# Patient Record
Sex: Male | Born: 1950 | Race: White | Hispanic: No | Marital: Married | Smoking: Never smoker
Health system: Southern US, Community
[De-identification: ages and names within clinical notes are randomized; demographics above are authoritative.]

## PROBLEM LIST (undated history)

## (undated) DIAGNOSIS — Q231 Congenital insufficiency of aortic valve: Secondary | ICD-10-CM

## (undated) DIAGNOSIS — I472 Ventricular tachycardia: Secondary | ICD-10-CM

## (undated) DIAGNOSIS — G4700 Insomnia, unspecified: Secondary | ICD-10-CM

## (undated) DIAGNOSIS — I10 Essential (primary) hypertension: Secondary | ICD-10-CM

## (undated) DIAGNOSIS — E782 Mixed hyperlipidemia: Secondary | ICD-10-CM

## (undated) DIAGNOSIS — C801 Malignant (primary) neoplasm, unspecified: Secondary | ICD-10-CM

## (undated) DIAGNOSIS — M6740 Ganglion, unspecified site: Secondary | ICD-10-CM

## (undated) HISTORY — PX: NASAL SINUS SURGERY: SHX719

## (undated) HISTORY — DX: Congenital insufficiency of aortic valve: Q23.1

## (undated) HISTORY — DX: Mixed hyperlipidemia: E78.2

## (undated) HISTORY — DX: Insomnia, unspecified: G47.00

## (undated) HISTORY — PX: APPENDECTOMY: SHX54

## (undated) HISTORY — DX: Essential (primary) hypertension: I10

## (undated) HISTORY — DX: Ventricular tachycardia: I47.2

## (undated) HISTORY — PX: BACK SURGERY: SHX140

## (undated) HISTORY — PX: WISDOM TOOTH EXTRACTION: SHX21

## (undated) HISTORY — PX: LUMBAR DISC SURGERY: SHX700

## (undated) HISTORY — PX: PROSTATE BIOPSY: SHX241

## (undated) HISTORY — PX: MOUTH SURGERY: SHX715

---

## 2015-11-17 DIAGNOSIS — I10 Essential (primary) hypertension: Secondary | ICD-10-CM | POA: Insufficient documentation

## 2015-11-17 DIAGNOSIS — N400 Enlarged prostate without lower urinary tract symptoms: Secondary | ICD-10-CM

## 2015-11-17 DIAGNOSIS — R7303 Prediabetes: Secondary | ICD-10-CM

## 2015-11-17 DIAGNOSIS — R972 Elevated prostate specific antigen [PSA]: Secondary | ICD-10-CM

## 2015-11-17 DIAGNOSIS — G4700 Insomnia, unspecified: Secondary | ICD-10-CM

## 2015-11-17 DIAGNOSIS — D696 Thrombocytopenia, unspecified: Secondary | ICD-10-CM

## 2015-11-17 DIAGNOSIS — E782 Mixed hyperlipidemia: Secondary | ICD-10-CM

## 2015-11-17 DIAGNOSIS — M51369 Other intervertebral disc degeneration, lumbar region without mention of lumbar back pain or lower extremity pain: Secondary | ICD-10-CM

## 2015-11-17 DIAGNOSIS — Q2381 Bicuspid aortic valve: Secondary | ICD-10-CM

## 2015-11-17 DIAGNOSIS — Q231 Congenital insufficiency of aortic valve: Secondary | ICD-10-CM

## 2015-11-17 DIAGNOSIS — I472 Ventricular tachycardia: Secondary | ICD-10-CM

## 2015-11-17 DIAGNOSIS — M5136 Other intervertebral disc degeneration, lumbar region: Secondary | ICD-10-CM | POA: Insufficient documentation

## 2015-11-17 DIAGNOSIS — N529 Male erectile dysfunction, unspecified: Secondary | ICD-10-CM | POA: Insufficient documentation

## 2015-11-17 DIAGNOSIS — I4729 Other ventricular tachycardia: Secondary | ICD-10-CM | POA: Insufficient documentation

## 2015-11-17 HISTORY — DX: Other intervertebral disc degeneration, lumbar region without mention of lumbar back pain or lower extremity pain: M51.369

## 2015-11-17 HISTORY — DX: Benign prostatic hyperplasia without lower urinary tract symptoms: N40.0

## 2015-11-17 HISTORY — DX: Thrombocytopenia, unspecified: D69.6

## 2015-11-17 HISTORY — DX: Bicuspid aortic valve: Q23.81

## 2015-11-17 HISTORY — DX: Male erectile dysfunction, unspecified: N52.9

## 2015-11-17 HISTORY — DX: Other ventricular tachycardia: I47.29

## 2015-11-17 HISTORY — DX: Ventricular tachycardia: I47.2

## 2015-11-17 HISTORY — DX: Congenital insufficiency of aortic valve: Q23.1

## 2015-11-17 HISTORY — DX: Elevated prostate specific antigen (PSA): R97.20

## 2015-11-17 HISTORY — DX: Insomnia, unspecified: G47.00

## 2015-11-17 HISTORY — DX: Essential (primary) hypertension: I10

## 2015-11-17 HISTORY — DX: Prediabetes: R73.03

## 2015-11-17 HISTORY — DX: Mixed hyperlipidemia: E78.2

## 2016-11-23 DIAGNOSIS — M1612 Unilateral primary osteoarthritis, left hip: Secondary | ICD-10-CM | POA: Insufficient documentation

## 2016-11-23 HISTORY — DX: Unilateral primary osteoarthritis, left hip: M16.12

## 2017-05-26 DIAGNOSIS — D1723 Benign lipomatous neoplasm of skin and subcutaneous tissue of right leg: Secondary | ICD-10-CM

## 2017-05-26 HISTORY — DX: Benign lipomatous neoplasm of skin and subcutaneous tissue of right leg: D17.23

## 2017-10-05 ENCOUNTER — Other Ambulatory Visit: Payer: Self-pay | Admitting: Sports Medicine

## 2017-10-05 ENCOUNTER — Ambulatory Visit (INDEPENDENT_AMBULATORY_CARE_PROVIDER_SITE_OTHER): Payer: BLUE CROSS/BLUE SHIELD | Admitting: Sports Medicine

## 2017-10-05 ENCOUNTER — Encounter: Payer: Self-pay | Admitting: Sports Medicine

## 2017-10-05 ENCOUNTER — Ambulatory Visit (INDEPENDENT_AMBULATORY_CARE_PROVIDER_SITE_OTHER): Payer: BLUE CROSS/BLUE SHIELD

## 2017-10-05 DIAGNOSIS — S93402A Sprain of unspecified ligament of left ankle, initial encounter: Secondary | ICD-10-CM | POA: Diagnosis not present

## 2017-10-05 DIAGNOSIS — IMO0001 Reserved for inherently not codable concepts without codable children: Secondary | ICD-10-CM

## 2017-10-05 DIAGNOSIS — M779 Enthesopathy, unspecified: Secondary | ICD-10-CM | POA: Diagnosis not present

## 2017-10-05 DIAGNOSIS — M25572 Pain in left ankle and joints of left foot: Secondary | ICD-10-CM

## 2017-10-05 MED ORDER — MELOXICAM 15 MG PO TABS: 15.0000 mg | ORAL_TABLET | Freq: Every day | ORAL | 0 refills | Status: DC

## 2017-10-05 NOTE — Progress Notes (Signed)
Subjective:  Glen Young is a 67 y.o. male patient who presents to office for evaluation of left ankle pain. Patient complains of continued pain in the ankle over the last 3 months after he turned his ankle and heard a pop states that the pain on average about 5 out of 10 throbbing in nature that can be intermittent depending on what he has been doing. Patient went to urgent care where he had x-rays and was told it was not broken and did not have any other treatment.  Patient denies any other pedal complaints.  Review of Systems  Musculoskeletal: Positive for joint pain.  All other systems reviewed and are negative.    There are no active problems to display for this patient.   Current Outpatient Medications on File Prior to Visit  Medication Sig Dispense Refill  . aspirin EC 81 MG tablet Take 81 mg by mouth daily.    Marland Kitchen lisinopril (PRINIVIL,ZESTRIL) 10 MG tablet Take 10 mg by mouth daily.    . tadalafil (CIALIS) 5 MG tablet Take 5 mg by mouth daily as needed for erectile dysfunction.     No current facility-administered medications on file prior to visit.     Allergies  Allergen Reactions  . Codeine Nausea And Vomiting    Objective:  General: Alert and oriented x3 in no acute distress  Dermatology: No open lesions bilateral lower extremities, no webspace macerations, no ecchymosis bilateral, all nails x 10 are well manicured.  Vascular: Dorsalis Pedis and Posterior Tibial pedal pulses palpable, Capillary Fill Time 3 seconds,(+) pedal hair growth bilateral, no edema bilateral lower extremities, Temperature gradient within normal limits.  Neurology: Michaell Cowing sensation intact via light touch bilateral. (- )Tinels sign bilateral.   Musculoskeletal: Mild tenderness with palpation at lateral ankle ligaments, sinus tarsi, and along peroneal tendon course of the left ankle. Negative talar tilt, Negative tib-fib stress, No frank instability however patient states that when he is walking and  standing sometimes it feels like his ankle is going to move under him. No pain with calf compression bilateral. Range of motion within normal limits with mild guarding on the left ankle. Strength within normal limits in all groups bilateral.   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  Assessment and Plan: Problem List Items Addressed This Visit    None    Visit Diagnoses    Left ankle pain, unspecified chronicity    -  Primary   Relevant Medications   meloxicam (MOBIC) 15 MG tablet   Other Relevant Orders   DG Ankle Complete Left   Grade 2 ankle sprain, left, initial encounter       Relevant Medications   meloxicam (MOBIC) 15 MG tablet   Tendonitis       Relevant Medications   meloxicam (MOBIC) 15 MG tablet      -Complete examination performed -Xrays reviewed -Discussed treatement options for possible sprain with tendinitis versus partial tear -Rx meloxicam to take as instructed -Dispensed ankle gauntlet to offer support and reduce excessive strain to peroneal tendons -Advised patient if pain is not better since his symptoms have been present over 3 months would recommend a MRI for further evaluation of tendons and joint at lateral ankle to rule out partial tear which is possible since patient's symptoms have been ongoing -  Patient to return to office in 3 weeks or sooner if condition worsens.  Asencion Islamitorya Taytem Ghattas, DPM

## 2017-10-26 ENCOUNTER — Ambulatory Visit (INDEPENDENT_AMBULATORY_CARE_PROVIDER_SITE_OTHER): Payer: BLUE CROSS/BLUE SHIELD | Admitting: Sports Medicine

## 2017-10-26 ENCOUNTER — Encounter: Payer: Self-pay | Admitting: Sports Medicine

## 2017-10-26 DIAGNOSIS — M25572 Pain in left ankle and joints of left foot: Secondary | ICD-10-CM | POA: Diagnosis not present

## 2017-10-26 DIAGNOSIS — S93402D Sprain of unspecified ligament of left ankle, subsequent encounter: Secondary | ICD-10-CM

## 2017-10-26 DIAGNOSIS — M779 Enthesopathy, unspecified: Secondary | ICD-10-CM

## 2017-10-26 NOTE — Progress Notes (Signed)
Subjective:  Glen Young is a 67 y.o. male patient who returns to office for follow-up evaluation of left ankle pain. Patient reports that pain in the left ankle seems to be getting better and that he is anti-inflammatory and the ankle support seems like it is helping.  Patient reports that he has also been very cautious when doing form work to be very careful on uneven surfaces or terrains.  Patient denies increase of pain, denies swelling, denies bruising, denies warmth, denies redness, or any other acute problems.  Patient denies any other pedal complaints at this time.  There are no active problems to display for this patient.   Current Outpatient Medications on File Prior to Visit  Medication Sig Dispense Refill  . aspirin EC 81 MG tablet Take 81 mg by mouth daily.    Marland Kitchen. lisinopril (PRINIVIL,ZESTRIL) 10 MG tablet Take 10 mg by mouth daily.    . meloxicam (MOBIC) 15 MG tablet Take 1 tablet (15 mg total) by mouth daily. 30 tablet 0  . tadalafil (CIALIS) 5 MG tablet Take 5 mg by mouth daily as needed for erectile dysfunction.     No current facility-administered medications on file prior to visit.     Allergies  Allergen Reactions  . Codeine Nausea And Vomiting    Objective:  General: Alert and oriented x3 in no acute distress  Dermatology: No open lesions bilateral lower extremities, no webspace macerations, no ecchymosis bilateral, all nails x 10 are well manicured.  Vascular: Dorsalis Pedis and Posterior Tibial pedal pulses palpable, Capillary Fill Time 3 seconds,(+) pedal hair growth bilateral, no edema bilateral lower extremities, Temperature gradient within normal limits.  Neurology: Michaell CowingGross sensation intact via light touch bilateral. (- )Tinels sign bilateral.   Musculoskeletal: Decreased tenderness with palpation at lateral ankle ligaments, sinus tarsi, and along peroneal tendon course of the left ankle. Negative talar tilt, Negative tib-fib stress, No frank instability. No  pain with calf compression bilateral. Range of motion within normal limits with no guarding on the left ankle. Strength within normal limits in all groups bilateral.   Assessment and Plan: Problem List Items Addressed This Visit    None    Visit Diagnoses    Left ankle pain, unspecified chronicity    -  Primary   Sprain of ligament of left ankle, subsequent encounter       Tendonitis          -Complete examination performed -Discussed continued care for sprain with tendinitis versus partial tear -Patient desires to hold off at this time on MRI -Continue with meloxicam until completed -Continue with ankle gauntlet for 1 more month and then slowly wean as instructed -Advised patient to call office if pain worsens after he has finished with his meloxicam so that we can move forward with ordering a MRI for further evaluation pain recurs or worsens once he has completed his medication -Patient to return to office as needed or sooner if condition worsens.  Asencion Islamitorya Cyle Kenyon, DPM

## 2017-11-23 DIAGNOSIS — M19029 Primary osteoarthritis, unspecified elbow: Secondary | ICD-10-CM

## 2017-11-23 HISTORY — DX: Primary osteoarthritis, unspecified elbow: M19.029

## 2017-12-09 DIAGNOSIS — M19042 Primary osteoarthritis, left hand: Secondary | ICD-10-CM

## 2017-12-09 HISTORY — DX: Primary osteoarthritis, left hand: M19.042

## 2018-03-21 ENCOUNTER — Encounter: Payer: Self-pay | Admitting: Cardiology

## 2018-03-21 ENCOUNTER — Ambulatory Visit (INDEPENDENT_AMBULATORY_CARE_PROVIDER_SITE_OTHER): Payer: BLUE CROSS/BLUE SHIELD | Admitting: Cardiology

## 2018-03-21 VITALS — BP 140/74 | HR 92 | Ht 74.0 in | Wt 194.2 lb

## 2018-03-21 DIAGNOSIS — Q231 Congenital insufficiency of aortic valve: Secondary | ICD-10-CM | POA: Diagnosis not present

## 2018-03-21 DIAGNOSIS — E782 Mixed hyperlipidemia: Secondary | ICD-10-CM

## 2018-03-21 DIAGNOSIS — I4729 Other ventricular tachycardia: Secondary | ICD-10-CM

## 2018-03-21 DIAGNOSIS — I472 Ventricular tachycardia: Secondary | ICD-10-CM

## 2018-03-21 DIAGNOSIS — I10 Essential (primary) hypertension: Secondary | ICD-10-CM

## 2018-03-21 NOTE — Patient Instructions (Signed)
Medication Instructions:  Your physician recommends that you continue on your current medications as directed. Please refer to the Current Medication list given to you today.  Labwork: None ordered  Testing/Procedures: Your physician has requested that you have an echocardiogram. Echocardiography is a painless test that uses sound waves to create images of your heart. It provides your doctor with information about the size and shape of your heart and how well your heart's chambers and valves are working. This procedure takes approximately one hour. There are no restrictions for this procedure.  Follow-Up: Your physician recommends that you schedule a follow-up appointment in: 1 year with Dr. Bing Matter. You will receive a letter or phone call to schedule your appointment.   Any Other Special Instructions Will Be Listed Below (If Applicable).     If you need a refill on your cardiac medications before your next appointment, please call your pharmacy.

## 2018-03-21 NOTE — Progress Notes (Signed)
Cardiology Office Note:    Date:  03/21/2018   ID:  Glen Young, DOB Jun 26, 1951, MRN [id number]  PCP:  Gordan Payment., MD  Cardiologist:  Gypsy Balsam, MD    Referring MD: Gordan Payment., MD   Chief Complaint  Patient presents with  . Follow-up    Seen over 3 years ago  . Pre-op Exam  Doing well but want to be checked  History of Present Illness:    Glen Young is a 67 y.o. male with history of bicuspid aortic valve.  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 Bicuspid aortic valve 11/17/2015  . Essential hypertension 11/17/2015  . Insomnia 11/17/2015  . Mixed hyperlipidemia 11/17/2015  . Non-sustained ventricular tachycardia (HCC) 11/17/2015    Past Surgical History:  Procedure Laterality Date  . APPENDECTOMY    . BACK SURGERY    . MOUTH SURGERY    . NASAL SINUS SURGERY    . PROSTATE SURGERY    . WISDOM TOOTH EXTRACTION      Current Medications: Current Meds  Medication Sig  . aspirin EC 81 MG tablet Take 81 mg by mouth daily.  Marland Kitchen lisinopril (PRINIVIL,ZESTRIL) 10 MG tablet Take 10 mg by mouth daily.  . rosuvastatin (CRESTOR) 10 MG tablet Take 1 tablet by mouth daily.  . tadalafil (CIALIS) 5 MG tablet Take 5 mg by mouth daily as needed for erectile dysfunction.     Allergies:   Codeine   Social History   Socioeconomic History  . Marital status: Married    Spouse name: Not on file  . Number of children: Not on file  . Years of education: Not on file  . Highest education level: Not on file  Occupational History  . Not on file  Social Needs  . Financial resource strain: Not on file  . Food insecurity:    Worry: Not on file    Inability: Not on file  .  Transportation needs:    Medical: Not on file    Non-medical: Not on file  Tobacco Use  . Smoking status: Never Smoker  . Smokeless tobacco: Never Used  Substance and Sexual Activity  . Alcohol use: Never    Frequency: Never  . Drug use: Never  . Sexual activity: Not on file  Lifestyle  . Physical activity:    Days per week: Not on file    Minutes per session: Not on file  . Stress: Not on file  Relationships  . Social connections:    Talks on phone: Not on file    Gets together: Not on file    Attends religious service: Not on file    Active member of club or organization: Not on file    Attends meetings of clubs or organizations: Not on file    Relationship status: Not on file  Other Topics Concern  . Not on file  Social History Narrative  . Not on file     Family History: The patient's family history includes Diabetes in his maternal uncle and paternal grandmother; Hypertension in his father. ROS:   Please see the history of present illness.    All 14 point review of systems negative  except as described per history of present illness  EKGs/Labs/Other Studies Reviewed:      Recent Labs: No results found for requested labs within last 8760 hours.  Recent Lipid Panel No results found for: CHOL, TRIG, HDL, CHOLHDL, VLDL, LDLCALC, LDLDIRECT  Physical Exam:    VS:  BP 140/74   Pulse 92   Ht 6\' 2"  (1.88 m)   Wt 194 lb 3.2 oz (88.1 kg)   SpO2 97%   BMI 24.93 kg/m     Wt Readings from Last 3 Encounters:  03/21/18 194 lb 3.2 oz (88.1 kg)     GEN:  Well nourished, well developed in no acute distress HEENT: Normal NECK: No JVD; No carotid bruits LYMPHATICS: No lymphadenopathy CARDIAC: RRR, diastolic murmur grade 1/6 best heard left border of the sternum, no rubs, no gallops RESPIRATORY:  Clear to auscultation without rales, wheezing or rhonchi  ABDOMEN: Soft, non-tender, non-distended MUSCULOSKELETAL:  No edema; No deformity  SKIN: Warm and dry LOWER  EXTREMITIES: no swelling NEUROLOGIC:  Alert and oriented x 3 PSYCHIATRIC:  Normal affect   ASSESSMENT:    1. Bicuspid aortic valve   2. Essential hypertension   3. Non-sustained ventricular tachycardia (HCC)   4. Mixed hyperlipidemia    PLAN:    In order of problems listed above:  Bicuspid aortic valve found to repeat echocardiogram to check on the valve. Essential hypertension blood pressure doing well we will continue present medications. Dyslipidemia he is on Crestor which I will continue.   Medication Adjustments/Labs and Tests Ordered: Current medicines are reviewed at length with the patient today.  Concerns regarding medicines are outlined above.  No orders of the defined types were placed in this encounter.  Medication changes: No orders of the defined types were placed in this encounter.   Signed, Georgeanna Lea, MD, [facility] 03/21/2018 3:25 PM    [facility]

## 2018-05-02 ENCOUNTER — Ambulatory Visit (INDEPENDENT_AMBULATORY_CARE_PROVIDER_SITE_OTHER): Payer: BLUE CROSS/BLUE SHIELD

## 2018-05-02 ENCOUNTER — Other Ambulatory Visit: Payer: Self-pay

## 2018-05-02 DIAGNOSIS — Q231 Congenital insufficiency of aortic valve: Secondary | ICD-10-CM

## 2018-05-02 DIAGNOSIS — I4729 Other ventricular tachycardia: Secondary | ICD-10-CM

## 2018-05-02 DIAGNOSIS — I472 Ventricular tachycardia: Secondary | ICD-10-CM | POA: Diagnosis not present

## 2018-05-02 NOTE — Progress Notes (Signed)
Complete echocardiogram has been performed.  Jimmy Merick Kelleher RDCS, RVT 

## 2018-05-11 DIAGNOSIS — N4340 Spermatocele of epididymis, unspecified: Secondary | ICD-10-CM

## 2018-05-11 HISTORY — DX: Spermatocele of epididymis, unspecified: N43.40

## 2018-05-26 DIAGNOSIS — D638 Anemia in other chronic diseases classified elsewhere: Secondary | ICD-10-CM | POA: Insufficient documentation

## 2018-05-26 DIAGNOSIS — Z862 Personal history of diseases of the blood and blood-forming organs and certain disorders involving the immune mechanism: Secondary | ICD-10-CM

## 2018-05-26 HISTORY — DX: Personal history of diseases of the blood and blood-forming organs and certain disorders involving the immune mechanism: Z86.2

## 2018-05-26 HISTORY — DX: Anemia in other chronic diseases classified elsewhere: D63.8

## 2018-05-29 DIAGNOSIS — J343 Hypertrophy of nasal turbinates: Secondary | ICD-10-CM

## 2018-05-29 HISTORY — DX: Hypertrophy of nasal turbinates: J34.3

## 2018-05-30 ENCOUNTER — Other Ambulatory Visit: Payer: Self-pay | Admitting: Orthopedic Surgery

## 2018-05-31 ENCOUNTER — Other Ambulatory Visit: Payer: Self-pay | Admitting: Orthopedic Surgery

## 2018-06-21 ENCOUNTER — Other Ambulatory Visit: Payer: Self-pay

## 2018-06-21 ENCOUNTER — Encounter (HOSPITAL_BASED_OUTPATIENT_CLINIC_OR_DEPARTMENT_OTHER): Payer: Self-pay | Admitting: *Deleted

## 2018-06-21 NOTE — Progress Notes (Signed)
Pt with EKG (03-21-18) and ECHO (05-02-18) results reviewed with Dr Casilda Carlsob Fitzgerald. [facility]

## 2018-06-27 ENCOUNTER — Encounter (HOSPITAL_BASED_OUTPATIENT_CLINIC_OR_DEPARTMENT_OTHER): Payer: Self-pay | Admitting: *Deleted

## 2018-06-27 ENCOUNTER — Ambulatory Visit (HOSPITAL_BASED_OUTPATIENT_CLINIC_OR_DEPARTMENT_OTHER): Payer: BLUE CROSS/BLUE SHIELD | Admitting: Anesthesiology

## 2018-06-27 ENCOUNTER — Ambulatory Visit (HOSPITAL_BASED_OUTPATIENT_CLINIC_OR_DEPARTMENT_OTHER)
Admission: RE | Admit: 2018-06-27 | Discharge: 2018-06-27 | Disposition: A | Discharge status: Home / Self Care | Payer: BLUE CROSS/BLUE SHIELD | Attending: Orthopedic Surgery | Admitting: Orthopedic Surgery

## 2018-06-27 ENCOUNTER — Encounter (HOSPITAL_BASED_OUTPATIENT_CLINIC_OR_DEPARTMENT_OTHER)
Admission: RE | Disposition: A | Discharge status: Home / Self Care | Payer: Self-pay | Source: Home / Self Care | Attending: Orthopedic Surgery

## 2018-06-27 ENCOUNTER — Other Ambulatory Visit: Payer: Self-pay

## 2018-06-27 DIAGNOSIS — M25842 Other specified joint disorders, left hand: Secondary | ICD-10-CM | POA: Insufficient documentation

## 2018-06-27 DIAGNOSIS — Z79899 Other long term (current) drug therapy: Secondary | ICD-10-CM | POA: Insufficient documentation

## 2018-06-27 DIAGNOSIS — M19042 Primary osteoarthritis, left hand: Secondary | ICD-10-CM | POA: Diagnosis not present

## 2018-06-27 DIAGNOSIS — I10 Essential (primary) hypertension: Secondary | ICD-10-CM | POA: Insufficient documentation

## 2018-06-27 HISTORY — PX: CYST EXCISION: SHX5701

## 2018-06-27 HISTORY — PX: ARTHROTOMY: SHX5728

## 2018-06-27 HISTORY — DX: Ganglion, unspecified site: M67.40

## 2018-06-27 SURGERY — CYST REMOVAL
Anesthesia: Regional | Site: Finger | Laterality: Left

## 2018-06-27 MED ORDER — OXYCODONE HCL 5 MG PO TABS: 5.0000 mg | ORAL_TABLET | Freq: Once | ORAL | Status: DC | PRN

## 2018-06-27 MED ORDER — PROPOFOL 500 MG/50ML IV EMUL
INTRAVENOUS | Status: DC | PRN
  Administered 2018-06-27: 100 ug/kg/min via INTRAVENOUS

## 2018-06-27 MED ORDER — LACTATED RINGERS IV SOLN
INTRAVENOUS | Status: DC
  Administered 2018-06-27: 10:00:00 via INTRAVENOUS

## 2018-06-27 MED ORDER — CEFAZOLIN SODIUM-DEXTROSE 2-4 GM/100ML-% IV SOLN
INTRAVENOUS | Status: AC
  Filled 2018-06-27: qty 100

## 2018-06-27 MED ORDER — OXYCODONE HCL 5 MG/5ML PO SOLN: 5.0000 mg | Freq: Once | ORAL | Status: DC | PRN

## 2018-06-27 MED ORDER — SCOPOLAMINE 1 MG/3DAYS TD PT72: 1.0000 | MEDICATED_PATCH | Freq: Once | TRANSDERMAL | Status: DC | PRN

## 2018-06-27 MED ORDER — BUPIVACAINE HCL (PF) 0.25 % IJ SOLN
INTRAMUSCULAR | Status: DC | PRN
  Administered 2018-06-27: 8 mL

## 2018-06-27 MED ORDER — FENTANYL CITRATE (PF) 100 MCG/2ML IJ SOLN
50.0000 ug | INTRAMUSCULAR | Status: DC | PRN
  Administered 2018-06-27: 50 ug via INTRAVENOUS
  Administered 2018-06-27: 25 ug via INTRAVENOUS

## 2018-06-27 MED ORDER — LABETALOL HCL 5 MG/ML IV SOLN
INTRAVENOUS | Status: DC | PRN
  Administered 2018-06-27 (×2): 5 mg via INTRAVENOUS

## 2018-06-27 MED ORDER — EPHEDRINE 5 MG/ML INJ
INTRAVENOUS | Status: AC
  Filled 2018-06-27: qty 20

## 2018-06-27 MED ORDER — FENTANYL CITRATE (PF) 100 MCG/2ML IJ SOLN
INTRAMUSCULAR | Status: AC
  Filled 2018-06-27: qty 2

## 2018-06-27 MED ORDER — MIDAZOLAM HCL 2 MG/2ML IJ SOLN
1.0000 mg | INTRAMUSCULAR | Status: DC | PRN
  Administered 2018-06-27: 1 mg via INTRAVENOUS

## 2018-06-27 MED ORDER — TRAMADOL HCL 50 MG PO TABS: 50.0000 mg | ORAL_TABLET | Freq: Four times a day (QID) | ORAL | 0 refills | Status: DC | PRN

## 2018-06-27 MED ORDER — FENTANYL CITRATE (PF) 100 MCG/2ML IJ SOLN: 25.0000 ug | INTRAMUSCULAR | Status: DC | PRN

## 2018-06-27 MED ORDER — PROPOFOL 10 MG/ML IV BOLUS
INTRAVENOUS | Status: AC
  Filled 2018-06-27: qty 20

## 2018-06-27 MED ORDER — MEPERIDINE HCL 25 MG/ML IJ SOLN: 6.2500 mg | INTRAMUSCULAR | Status: DC | PRN

## 2018-06-27 MED ORDER — LIDOCAINE HCL (PF) 0.5 % IJ SOLN
INTRAMUSCULAR | Status: DC | PRN
  Administered 2018-06-27: 40 mL via INTRAVENOUS

## 2018-06-27 MED ORDER — CEFAZOLIN SODIUM-DEXTROSE 2-4 GM/100ML-% IV SOLN
2.0000 g | INTRAVENOUS | Status: AC
  Administered 2018-06-27: 2 g via INTRAVENOUS

## 2018-06-27 MED ORDER — MIDAZOLAM HCL 2 MG/2ML IJ SOLN
INTRAMUSCULAR | Status: AC
  Filled 2018-06-27: qty 2

## 2018-06-27 MED ORDER — CHLORHEXIDINE GLUCONATE 4 % EX LIQD: 60.0000 mL | Freq: Once | CUTANEOUS | Status: DC

## 2018-06-27 SURGICAL SUPPLY — 49 items
BANDAGE COBAN STERILE 2 (GAUZE/BANDAGES/DRESSINGS) IMPLANT
BLADE MINI RND TIP GREEN BEAV (BLADE) IMPLANT
BLADE SURG 15 STRL LF DISP TIS (BLADE) ×1 IMPLANT
BLADE SURG 15 STRL SS (BLADE) ×2
BNDG COHESIVE 1X5 TAN STRL LF (GAUZE/BANDAGES/DRESSINGS) ×3 IMPLANT
BNDG COHESIVE 3X5 TAN STRL LF (GAUZE/BANDAGES/DRESSINGS) IMPLANT
BNDG ESMARK 4X9 LF (GAUZE/BANDAGES/DRESSINGS) IMPLANT
BNDG GAUZE ELAST 4 BULKY (GAUZE/BANDAGES/DRESSINGS) IMPLANT
CHLORAPREP W/TINT 26ML (MISCELLANEOUS) ×3 IMPLANT
CORD BIPOLAR FORCEPS 12FT (ELECTRODE) ×3 IMPLANT
COVER BACK TABLE 60X90IN (DRAPES) ×3 IMPLANT
COVER MAYO STAND STRL (DRAPES) ×3 IMPLANT
COVER WAND RF STERILE (DRAPES) IMPLANT
CUFF TOURNIQUET SINGLE 18IN (TOURNIQUET CUFF) ×3 IMPLANT
DECANTER SPIKE VIAL GLASS SM (MISCELLANEOUS) IMPLANT
DRAIN PENROSE 1/2X12 LTX STRL (WOUND CARE) IMPLANT
DRAPE EXTREMITY T 121X128X90 (DRAPE) ×3 IMPLANT
DRAPE SURG 17X23 STRL (DRAPES) ×3 IMPLANT
GAUZE SPONGE 4X4 12PLY STRL (GAUZE/BANDAGES/DRESSINGS) ×3 IMPLANT
GAUZE XEROFORM 1X8 LF (GAUZE/BANDAGES/DRESSINGS) ×3 IMPLANT
GLOVE BIO SURGEON STRL SZ 6.5 (GLOVE) ×2 IMPLANT
GLOVE BIO SURGEONS STRL SZ 6.5 (GLOVE) ×1
GLOVE BIOGEL PI IND STRL 7.0 (GLOVE) ×2 IMPLANT
GLOVE BIOGEL PI IND STRL 8.5 (GLOVE) ×1 IMPLANT
GLOVE BIOGEL PI INDICATOR 7.0 (GLOVE) ×4
GLOVE BIOGEL PI INDICATOR 8.5 (GLOVE) ×2
GLOVE SURG ORTHO 8.0 STRL STRW (GLOVE) ×3 IMPLANT
GOWN STRL REUS W/ TWL LRG LVL3 (GOWN DISPOSABLE) ×2 IMPLANT
GOWN STRL REUS W/TWL LRG LVL3 (GOWN DISPOSABLE) ×4
GOWN STRL REUS W/TWL XL LVL3 (GOWN DISPOSABLE) ×3 IMPLANT
NEEDLE PRECISIONGLIDE 27X1.5 (NEEDLE) ×3 IMPLANT
NS IRRIG 1000ML POUR BTL (IV SOLUTION) ×3 IMPLANT
PACK BASIN DAY SURGERY FS (CUSTOM PROCEDURE TRAY) ×3 IMPLANT
PAD CAST 3X4 CTTN HI CHSV (CAST SUPPLIES) IMPLANT
PADDING CAST ABS 3INX4YD NS (CAST SUPPLIES)
PADDING CAST ABS 4INX4YD NS (CAST SUPPLIES) ×2
PADDING CAST ABS COTTON 3X4 (CAST SUPPLIES) IMPLANT
PADDING CAST ABS COTTON 4X4 ST (CAST SUPPLIES) ×1 IMPLANT
PADDING CAST COTTON 3X4 STRL (CAST SUPPLIES)
SPLINT FINGER 3.25 BULB 911905 (SOFTGOODS) ×3 IMPLANT
SPLINT PLASTER CAST XFAST 3X15 (CAST SUPPLIES) IMPLANT
SPLINT PLASTER XTRA FASTSET 3X (CAST SUPPLIES)
STOCKINETTE 4X48 STRL (DRAPES) ×3 IMPLANT
SUT ETHILON 4 0 PS 2 18 (SUTURE) ×3 IMPLANT
SUT VIC AB 4-0 P2 18 (SUTURE) IMPLANT
SYR BULB 3OZ (MISCELLANEOUS) ×3 IMPLANT
SYR CONTROL 10ML LL (SYRINGE) IMPLANT
TOWEL GREEN STERILE FF (TOWEL DISPOSABLE) ×6 IMPLANT
UNDERPAD 30X30 (UNDERPADS AND DIAPERS) ×3 IMPLANT

## 2018-06-27 NOTE — Op Note (Signed)
NAME: Glen Young MEDICAL RECORD NO: [id number] DATE OF BIRTH: Oct 06, 1950 [facility]  [facility] PHYSICIAN: Nicki ReaperGARY R. 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   ASSISTANT: none   ANESTHESIA:  Bier block with sedation and Local   INTRAVENOUS FLUIDS:  Per anesthesia flow sheet.   ESTIMATED BLOOD LOSS:  Minimal.   COMPLICATIONS:  None.   SPECIMENS:   Cyst and osteophytes   TOURNIQUET TIME:    Total Tourniquet Time Documented: Forearm (Left) - 23 minutes Total: Forearm (Left) - 23 minutes    DISPOSITION:  Stable to PACU.   INDICATIONS: Patient is a 67 year old male with a history of a mass on the distal phalangeal joint of his right middle finger.  This does transilluminate he is desirous having this excised.  Pre-peri-and postoperative course are discussed along with risks and complications.  He is aware that there is no guarantee to the surgery the possibility of infection recurrence ureters to arteries nerves tendons and complete release symptoms dystrophy.  Preoperative area the patient is seen the extremity marked by both patient and surgeon antibiotic given  OPERATIVE COURSE: Patient is brought to the operating room where form based IV regional anesthetic was carried out without difficulty.  Was prepped using ChloraPrep in supine position with left arm free.  A three-minute dry time was allowed timeout taken to confirm patient procedure.  A metacarpal block was given quarter percent bupivacaine without epinephrine.  Approximately 8 cc was used.  After adequate anesthesia was afforded the patient a curvilinear incision was made over the distal phalangeal joint  carried down through subcutaneous tissue.  Bleeders were electrocauterized as necessary with bipolar.  A tunnel was then made under the skin to the cyst distally.  This was removed with a hemostatic rondure and house curette.  The specimen was sent to pathology.  The joint was then opened on its ulnar border.  Just lateral to the extensor tendon.  A synovectomy was performed along with osteophytes from the middle phalanx less than was sent to pathology.  The wound was copiously irrigated with saline.  The skin was closed interrupted 4-0 nylon sutures.  A sterile compressive dressing splint to the finger was applied.  And deflation of the tourniquet all fingers immediately pink.  He was taken to the recovery room for observation in satisfactory condition.  He will be discharged home [facility] in 1 week on Tylenol and ibuprofen for pain with tramadol for breakthrough   Cindee SaltGary Yehia Mcbain, MD Electronically signed, 06/27/18

## 2018-06-27 NOTE — Transfer of Care (Signed)
Immediate Anesthesia Transfer of Care Note  Patient: Glen Young  Procedure(s) Performed: EXCISION MUCOID CYST LEFT MIDDLE FINGER (Left Finger) LEFT MIDDLE FINGER DISTAL INTERPHALANGEAL JOINT ARTHROTOMY (Left Finger)  Patient Location: PACU  Anesthesia Type:MAC and Bier block  Level of Consciousness: drowsy and patient cooperative  Airway & Oxygen Therapy: Patient Spontanous Breathing and Patient connected to face mask oxygen  Post-op Assessment: Report given to RN and Post -op Vital signs reviewed and stable  Post vital signs: Reviewed and stable  Last Vitals:  Vitals Value Taken Time  BP 116/75 06/27/2018 11:40 AM  Temp    Pulse 68 06/27/2018 11:41 AM  Resp 13 06/27/2018 11:41 AM  SpO2 100 % 06/27/2018 11:41 AM  Vitals shown include unvalidated device data.  Last Pain:  Vitals:   06/27/18 1001  TempSrc: Oral  PainSc: 0-No pain      Patients Stated Pain Goal: 1 (37/09/64 3838)  Complications: No apparent anesthesia complications

## 2018-06-27 NOTE — Anesthesia Postprocedure Evaluation (Signed)
Anesthesia Post Note  Patient: Glen Young  Procedure(s) Performed: EXCISION MUCOID CYST LEFT MIDDLE FINGER (Left Finger) LEFT MIDDLE FINGER DISTAL INTERPHALANGEAL JOINT ARTHROTOMY (Left Finger)     Patient location during evaluation: PACU Anesthesia Type: Bier Block Level of consciousness: awake and alert Pain management: pain level controlled Vital Signs Assessment: post-procedure vital signs reviewed and stable Respiratory status: spontaneous breathing Cardiovascular status: stable Anesthetic complications: no    Last Vitals:  Vitals:   06/27/18 1215 06/27/18 1245  BP: 138/77 (!) 151/74  Pulse: 70 68  Resp: 17 20  Temp:  36.7 C  SpO2: 99% 99%    Last Pain:  Vitals:   06/27/18 1245  TempSrc:   PainSc: 0-No pain                 Lewie LoronJohn Devinn Voshell

## 2018-06-27 NOTE — Anesthesia Procedure Notes (Signed)
Anesthesia Regional Block: Bier block (IV Regional)  ° °Pre-Anesthetic Checklist: ,, timeout performed, Correct Patient, Correct Site, Correct Laterality, Correct Procedure, Correct Position, site marked, Risks and benefits discussed,  Surgical consent,  Pre-op evaluation,  At surgeon's request and post-op pain management ° °Laterality: Left ° °Prep: alcohol swabs     °  ° °Procedures:,,,,, intact distal pulses, Esmarch exsanguination, single tourniquet utilized, #20gu IV placed  °Narrative:  °CRNA: Kelly, Michelle M, CRNA ° ° ° ° ° °

## 2018-06-27 NOTE — Discharge Instructions (Addendum)

## 2018-06-27 NOTE — Brief Op Note (Signed)
06/27/2018  11:37 AM  PATIENT:  Glen Young  67 y.o. male  PRE-OPERATIVE DIAGNOSIS:  MUCOID CYST LEFT MIDDLE FINGER DISTAL INTERPHALANGEAL JOINT M67.4  POST-OPERATIVE DIAGNOSIS:  MUCOID CYST LEFT MIDDLE FINGER DISTAL   PROCEDURE:  Procedure(s): EXCISION MUCOID CYST LEFT MIDDLE FINGER (Left) LEFT MIDDLE FINGER DISTAL INTERPHALANGEAL JOINT ARTHROTOMY (Left)  SURGEON:  Surgeon(s) and Role:    Cindee Salt* Rumi Kolodziej, MD - Primary  PHYSICIAN ASSISTANT:   ASSISTANTS: none   ANESTHESIA:   local, regional and IV sedation  EBL: 1ml  BLOOD ADMINISTERED:none  DRAINS: none   LOCAL MEDICATIONS USED:  BUPIVICAINE   SPECIMEN:  Excision  DISPOSITION OF SPECIMEN:  PATHOLOGY  COUNTS:  YES  TOURNIQUET:   Total Tourniquet Time Documented: Forearm (Left) - 23 minutes Total: Forearm (Left) - 23 minutes   DICTATION: .Reubin Milanragon Dictation  PLAN OF CARE: Discharge to home after PACU  PATIENT DISPOSITION:  PACU - hemodynamically stable.

## 2018-06-27 NOTE — Anesthesia Preprocedure Evaluation (Addendum)
Anesthesia Evaluation  Patient identified by MRN, date of birth, ID band Patient awake    Reviewed: Allergy & Precautions, NPO status , Patient's Chart, lab work & pertinent test results  Airway Mallampati: II  TM Distance: >3 FB Neck ROM: Full    Dental  (+) Dental Advisory Given   Pulmonary neg pulmonary ROS,    Pulmonary exam normal breath sounds clear to auscultation       Cardiovascular hypertension, Pt. on medications + Valvular Problems/Murmurs AI  Rhythm:Regular Rate:Normal + Diastolic murmurs Echo 04/2018 - Left ventricle: The cavity size was normal. Wall thickness was normal. Systolic function was normal. The estimated ejection fraction was in the range of 55% to 60%. Wall motion was normal; there were no regional wall motion abnormalities. Doppler parameters are consistent with abnormal left ventricular relaxation (grade 1 diastolic dysfunction). - Aortic valve: Bicuspid; normal thickness leaflets. There was moderate regurgitation. - Aortic root: The aortic root was moderately dilated. - Ascending aorta: The ascending aorta was moderately dilated. - Mitral valve: There was mild regurgitation.   Neuro/Psych negative neurological ROS  negative psych ROS   GI/Hepatic negative GI ROS, Neg liver ROS,   Endo/Other  negative endocrine ROS  Renal/GU negative Renal ROS     Musculoskeletal negative musculoskeletal ROS (+)   Abdominal   Peds  Hematology negative hematology ROS (+)   Anesthesia Other Findings   Reproductive/Obstetrics                             Anesthesia Physical Anesthesia Plan  ASA: III  Anesthesia Plan: Bier Block and Bier Block-LIDOCAINE ONLY   Post-op Pain Management:    Induction: Intravenous  PONV Risk Score and Plan: 1 and Propofol infusion, Ondansetron and Treatment may vary due to age or medical condition  Airway Management Planned: Simple Face  Mask  Additional Equipment: None  Intra-op Plan:   Post-operative Plan:   Informed Consent: I have reviewed the patients History and Physical, chart, labs and discussed the procedure including the risks, benefits and alternatives for the proposed anesthesia with the patient or authorized representative who has indicated his/her understanding and acceptance.   Dental advisory given  Plan Discussed with: CRNA  Anesthesia Plan Comments:        Anesthesia Quick Evaluation

## 2018-06-27 NOTE — H&P (Signed)
Glen Young is an 67 y.o. male.   Chief Complaint: mass left middle fingerHPI: Glen BachCecil is a 67 year old right-hand-dominant male referred by Dr. Fatima BlankKimmel for consultation regarding a mass on his left middle finger distal interphalangeal joint while nailbed area. This has increased in size over the past several months. He recalls no history of injury. He states it is been present approximately 2 to 3 months. He is not having any pain. There is no history of injury. He has no history of diabetes thyroid problems or gout. Does have a history of arthritis. Family history is positive diabetes negative for thyroid problems arthritis and gout. He has been tested for diabetes. He is a Visual merchandiserfarmer.   Past Medical History:  Diagnosis Date  . Bicuspid aortic valve 11/17/2015  . Essential hypertension 11/17/2015  . Insomnia 11/17/2015  . Mixed hyperlipidemia 11/17/2015  . 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 Non-sustained ventricular tachycardia (HCC) 11/17/2015    Past Surgical History:  Procedure Laterality Date  . APPENDECTOMY    . BACK SURGERY    . LUMBAR DISC SURGERY    . MOUTH SURGERY    . NASAL SINUS SURGERY    . WISDOM TOOTH EXTRACTION      Family History  Problem Relation Age of Onset  . Diabetes Maternal Uncle   . Diabetes Paternal Grandmother   . Hypertension Father    Social History:  reports that he has never smoked. He has never used smokeless tobacco. He reports that he does not drink alcohol or use drugs.  Allergies:  Allergies  Allergen Reactions  . Codeine Nausea And Vomiting    No medications prior to admission.    No results found for this or any previous visit (from the past 48 hour(s)).  No results found.   Pertinent items are noted in HPI.  Height 6\' 2"  (1.88 m), weight 86.2 kg.  General appearance: alert, cooperative and appears stated age Head: Normocephalic, without obvious abnormality Neck: no JVD Resp: clear to auscultation bilaterally Cardio: regular  rate and rhythm, S1, S2 normal, no murmur, click, rub or gallop GI: soft, non-tender; bowel sounds normal; no masses,  no organomegaly Extremities: mass left middle finger Pulses: 2+ and symmetric Skin: Skin color, texture, turgor normal. No rashes or lesions Neurologic: Grossly normal Incision/Wound: Na   Assessment/Plan Assessment:  1. Mucoid cyst of joint Left middle 2. Osteoarthritis of finger of left hand    Plan: We have discussed the etiology of the problem with him. We have discussed possibility of surgical excision with debridement of the distal and phalangeal joint. Pre-peri-and postoperative course are discussed along with risk applications. He is aware that there is no guarantee to the surgery the possibility of infection recurrence injury to arteries nerves tendons complete relief symptoms and dystrophy. Like to proceed and isscheduled for excision.t   [name] 06/27/2018, 5:32 AM

## 2018-06-28 ENCOUNTER — Encounter (HOSPITAL_BASED_OUTPATIENT_CLINIC_OR_DEPARTMENT_OTHER): Payer: Self-pay | Admitting: Orthopedic Surgery

## 2018-12-19 DIAGNOSIS — J339 Nasal polyp, unspecified: Secondary | ICD-10-CM

## 2018-12-19 HISTORY — DX: Nasal polyp, unspecified: J33.9

## 2018-12-25 DIAGNOSIS — M8949 Other hypertrophic osteoarthropathy, multiple sites: Secondary | ICD-10-CM

## 2018-12-25 DIAGNOSIS — M159 Polyosteoarthritis, unspecified: Secondary | ICD-10-CM

## 2018-12-25 DIAGNOSIS — M150 Primary generalized (osteo)arthritis: Secondary | ICD-10-CM

## 2018-12-25 HISTORY — DX: Other hypertrophic osteoarthropathy, multiple sites: M89.49

## 2018-12-25 HISTORY — DX: Polyosteoarthritis, unspecified: M15.9

## 2018-12-25 HISTORY — DX: Primary generalized (osteo)arthritis: M15.0

## 2019-06-19 ENCOUNTER — Other Ambulatory Visit: Payer: Self-pay

## 2019-06-19 ENCOUNTER — Encounter: Payer: Self-pay | Admitting: Cardiology

## 2019-06-19 ENCOUNTER — Ambulatory Visit (INDEPENDENT_AMBULATORY_CARE_PROVIDER_SITE_OTHER): Payer: Medicare Other | Admitting: Cardiology

## 2019-06-19 VITALS — BP 142/76 | HR 76 | Ht 74.0 in | Wt 200.0 lb

## 2019-06-19 DIAGNOSIS — E782 Mixed hyperlipidemia: Secondary | ICD-10-CM

## 2019-06-19 DIAGNOSIS — Q231 Congenital insufficiency of aortic valve: Secondary | ICD-10-CM

## 2019-06-19 DIAGNOSIS — I10 Essential (primary) hypertension: Secondary | ICD-10-CM | POA: Diagnosis not present

## 2019-06-19 NOTE — Progress Notes (Signed)
Cardiology Office Note:    Date:  06/19/2019   ID:  [name], DOB 1951/01/18, MRN [id number]  PCP:  Gordan Payment., MD  Cardiologist:  Gypsy Balsam, MD    Referring MD: Gordan Payment., MD   Chief Complaint  Patient presents with  . Follow-up  Doing great  History of Present Illness:    Glen Young is a 68 y.o. male with bicuspid arctic valve likely no stenosis only moderate aortic insufficiency.  He is aortic root size last time was 41 mm.  He comes today to my office for follow-up.  Overall doing great.  He is asymptomatic.  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  He has he tells me he cannot do as much as he did when he was 20 but overall seems to be doing very well.  Recently he ordered elliptical and waiting for delivery of that he is planning to exercise on the regular basis which I encouraged.  However I warned him against using too much of isovolumetric exercises.  Past Medical History:  Diagnosis Date  . Bicuspid aortic valve 11/17/2015  . Essential hypertension 11/17/2015  . Insomnia 11/17/2015  . Mixed hyperlipidemia 11/17/2015  . 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 Non-sustained ventricular tachycardia (HCC) 11/17/2015    Past Surgical History:  Procedure Laterality Date  . APPENDECTOMY    . ARTHROTOMY Left 06/27/2018   Procedure: LEFT MIDDLE FINGER DISTAL INTERPHALANGEAL JOINT ARTHROTOMY;  Surgeon: Cindee Salt, MD;  Location: [facility];  Service: Orthopedics;  Laterality: Left;  . BACK SURGERY    . CYST EXCISION Left 06/27/2018   Procedure: EXCISION MUCOID CYST LEFT MIDDLE FINGER;  Surgeon: Cindee Salt, MD;  Location: [facility];  Service: Orthopedics;  Laterality: Left;  . LUMBAR DISC SURGERY    . MOUTH SURGERY    . NASAL SINUS SURGERY    . WISDOM TOOTH EXTRACTION      Current Medications: Current Meds   Medication Sig  . aspirin EC 81 MG tablet Take 81 mg by mouth daily.  Marland Kitchen ibuprofen (ADVIL,MOTRIN) 800 MG tablet Take 800 mg by mouth every 8 (eight) hours as needed.  Marland Kitchen lisinopril (PRINIVIL,ZESTRIL) 10 MG tablet Take 10 mg by mouth daily.  . Multiple Vitamins-Minerals (PRESERVISION AREDS 2) CAPS Take by mouth.  . rosuvastatin (CRESTOR) 10 MG tablet Take 1 tablet by mouth daily.  . tadalafil (CIALIS) 5 MG tablet Take 5 mg by mouth daily as needed for erectile dysfunction.  . traMADol (ULTRAM) 50 MG tablet Take 1 tablet (50 mg total) by mouth every 6 (six) hours as needed.     Allergies:   Codeine   Social History   Socioeconomic History  . Marital status: Married    Spouse name: Not on file  . Number of children: Not on file  . Years of education: Not on file  . Highest education level: Not on file  Occupational History  . Not on file  Social Needs  . Financial resource strain: Not on file  . Food insecurity    Worry: Not on file    Inability: Not on file  . Transportation needs    Medical: Not on file    Non-medical: Not on file  Tobacco Use  . Smoking status: Never Smoker  . Smokeless tobacco: Never Used  Substance and Sexual Activity  . Alcohol use: Never    Frequency: Never  . Drug use: Never  . Sexual activity: Not on file  Lifestyle  . Physical activity    Days per week: Not on file    Minutes per session: Not on file  . Stress: Not on file  Relationships  . Social Herbalist on phone: Not on file    Gets together: Not on file    Attends religious service: Not on file    Active member of club or organization: Not on file    Attends meetings of clubs or organizations: Not on file    Relationship status: Not on file  Other Topics Concern  . Not on file  Social History Narrative  . Not on file     Family History: The patient's family history includes Diabetes in his maternal uncle and paternal grandmother; Hypertension in his father. ROS:    Please see the history of present illness.    All 14 point review of systems negative except as described per history of present illness  EKGs/Labs/Other Studies Reviewed:      Recent Labs: No results found for requested labs within last 8760 hours.  Recent Lipid Panel No results found for: CHOL, TRIG, HDL, CHOLHDL, VLDL, LDLCALC, LDLDIRECT  Physical Exam:    VS:  BP (!) 142/76   Pulse 76   Ht 6\' 2"  (1.88 m)   Wt 200 lb (90.7 kg)   SpO2 98%   BMI 25.68 kg/m     Wt Readings from Last 3 Encounters:  06/19/19 200 lb (90.7 kg)  06/27/18 193 lb 9 oz (87.8 kg)  03/21/18 194 lb 3.2 oz (88.1 kg)     GEN:  Well nourished, well developed in no acute distress HEENT: Normal NECK: No JVD; No carotid bruits LYMPHATICS: No lymphadenopathy CARDIAC: RRR, blowing diastolic murmur best heard at the left border of the sternum, no rubs, no gallops RESPIRATORY:  Clear to auscultation without rales, wheezing or rhonchi  ABDOMEN: Soft, non-tender, non-distended MUSCULOSKELETAL:  No edema; No deformity  SKIN: Warm and dry LOWER EXTREMITIES: no swelling NEUROLOGIC:  Alert and oriented x 3 PSYCHIATRIC:  Normal affect   ASSESSMENT:    1. Bicuspid aortic valve   2. Mixed hyperlipidemia   3. Essential hypertension    PLAN:    In order of problems listed above:  1. Bicuspid aortic valve.  His problem is aortic insufficiency.  Echocardiogram will be done.  Will look also the aortic root. 2. Mild dyslipidemia followed by primary care physician she is scheduled to see him in December 23.  Will wait for fasting blood profile. 3. Essential hypertension seems to be well controlled.  Overall he is doing fine.  We will follow him for his bicuspid arctic valve.  The problem is aortic insufficiency.  He also got a large aortic root measuring 41 mm.  We will recheck with echocardiogram.  Anticipate echocardiogram to be done in January   Medication Adjustments/Labs and Tests Ordered: Current  medicines are reviewed at length with the patient today.  Concerns regarding medicines are outlined above.  No orders of the defined types were placed in this encounter.  Medication changes: No orders of the defined types were placed in this encounter.   Signed, Park Liter, MD, [facility] 06/19/2019 9:04 AM    [place]

## 2019-06-19 NOTE — Patient Instructions (Signed)
Medication Instructions:  Your physician recommends that you continue on your current medications as directed. Please refer to the Current Medication list given to you today.  *If you need a refill on your cardiac medications before your next appointment, please call your pharmacy*  Lab Work: None.  If you have labs (blood work) drawn today and your tests are completely normal, you will receive your results only by: . MyChart Message (if you have MyChart) OR . A paper copy in the mail If you have any lab test that is abnormal or we need to change your treatment, we will call you to review the results.  Testing/Procedures: Your physician has requested that you have an echocardiogram. Echocardiography is a painless test that uses sound waves to create images of your heart. It provides your doctor with information about the size and shape of your heart and how well your heart's chambers and valves are working. This procedure takes approximately one hour. There are no restrictions for this procedure.    Follow-Up: At [facility], you and your health needs are our priority.  As part of our continuing mission to provide you with exceptional heart care, we have created designated Provider Care Teams.  These Care Teams include your primary Cardiologist (physician) and Advanced Practice Providers (APPs -  Physician Assistants and Nurse Practitioners) who all work together to provide you with the care you need, when you need it.  Your next appointment:   1 year(s)  The format for your next appointment:   In Person  Provider:   Robert Krasowski, MD  Other Instructions   Echocardiogram An echocardiogram is a procedure that uses painless sound waves (ultrasound) to produce an image of the heart. Images from an echocardiogram can provide important information about:  Signs of coronary artery disease (CAD).  Aneurysm detection. An aneurysm is a weak or damaged part of an artery wall that  bulges out from the normal force of blood pumping through the body.  Heart size and shape. Changes in the size or shape of the heart can be associated with certain conditions, including heart failure, aneurysm, and CAD.  Heart muscle function.  Heart valve function.  Signs of a past heart attack.  Fluid buildup around the heart.  Thickening of the heart muscle.  A tumor or infectious growth around the heart valves. Tell a health care provider about:  Any allergies you have.  All medicines you are taking, including vitamins, herbs, eye drops, creams, and over-the-counter medicines.  Any blood disorders you have.  Any surgeries you have had.  Any medical conditions you have.  Whether you are pregnant or may be pregnant. What are the risks? Generally, this is a safe procedure. However, problems may occur, including:  Allergic reaction to dye (contrast) that may be used during the procedure. What happens before the procedure? No specific preparation is needed. You may eat and drink normally. What happens during the procedure?   An IV tube may be inserted into one of your veins.  You may receive contrast through this tube. A contrast is an injection that improves the quality of the pictures from your heart.  A gel will be applied to your chest.  A wand-like tool (transducer) will be moved over your chest. The gel will help to transmit the sound waves from the transducer.  The sound waves will harmlessly bounce off of your heart to allow the heart images to be captured in real-time motion. The images will be recorded   on a computer. The procedure may vary among health care providers and hospitals. What happens after the procedure?  You may return to your normal, everyday life, including diet, activities, and medicines, unless your health care provider tells you not to do that. Summary  An echocardiogram is a procedure that uses painless sound waves (ultrasound) to produce  an image of the heart.  Images from an echocardiogram can provide important information about the size and shape of your heart, heart muscle function, heart valve function, and fluid buildup around your heart.  You do not need to do anything to prepare before this procedure. You may eat and drink normally.  After the echocardiogram is completed, you may return to your normal, everyday life, unless your health care provider tells you not to do that. This information is not intended to replace advice given to you by your health care provider. Make sure you discuss any questions you have with your health care provider. Document Released: 06/25/2000 Document Revised: 10/19/2018 Document Reviewed: 07/31/2016 Elsevier Patient Education  2020 Elsevier Inc[employer]   

## 2019-07-08 DIAGNOSIS — R3129 Other microscopic hematuria: Secondary | ICD-10-CM | POA: Insufficient documentation

## 2019-07-08 DIAGNOSIS — E611 Iron deficiency: Secondary | ICD-10-CM

## 2019-07-08 DIAGNOSIS — Z8639 Personal history of other endocrine, nutritional and metabolic disease: Secondary | ICD-10-CM

## 2019-07-08 HISTORY — DX: Iron deficiency: E61.1

## 2019-07-08 HISTORY — DX: Personal history of other endocrine, nutritional and metabolic disease: Z86.39

## 2019-07-08 HISTORY — DX: Other microscopic hematuria: R31.29

## 2019-07-20 ENCOUNTER — Ambulatory Visit (INDEPENDENT_AMBULATORY_CARE_PROVIDER_SITE_OTHER): Payer: Medicare Other

## 2019-07-20 ENCOUNTER — Other Ambulatory Visit: Payer: Self-pay

## 2019-07-20 DIAGNOSIS — Q231 Congenital insufficiency of aortic valve: Secondary | ICD-10-CM | POA: Diagnosis not present

## 2019-07-20 NOTE — Progress Notes (Unsigned)
Complete echocardiogram has been performed.  Jimmy Kristianne Albin RDCS, RVT 

## 2020-08-29 DIAGNOSIS — R6889 Other general symptoms and signs: Secondary | ICD-10-CM

## 2020-08-29 HISTORY — DX: Other general symptoms and signs: R68.89

## 2020-09-03 ENCOUNTER — Other Ambulatory Visit: Payer: Self-pay | Admitting: Radiology

## 2020-09-03 DIAGNOSIS — R6889 Other general symptoms and signs: Secondary | ICD-10-CM

## 2020-09-22 ENCOUNTER — Ambulatory Visit
Admission: RE | Admit: 2020-09-22 | Discharge: 2020-09-22 | Disposition: A | Discharge status: Home / Self Care | Payer: Medicare Other | Source: Ambulatory Visit | Attending: Radiology | Admitting: Radiology

## 2020-09-22 DIAGNOSIS — R6889 Other general symptoms and signs: Secondary | ICD-10-CM

## 2020-09-22 MED ORDER — GADOBENATE DIMEGLUMINE 529 MG/ML IV SOLN
18.0000 mL | Freq: Once | INTRAVENOUS | Status: AC | PRN
  Administered 2020-09-22: 18 mL via INTRAVENOUS

## 2020-09-23 DIAGNOSIS — M6740 Ganglion, unspecified site: Secondary | ICD-10-CM | POA: Insufficient documentation

## 2020-09-25 ENCOUNTER — Other Ambulatory Visit: Payer: Self-pay

## 2020-09-25 ENCOUNTER — Ambulatory Visit (INDEPENDENT_AMBULATORY_CARE_PROVIDER_SITE_OTHER): Payer: Medicare Other | Admitting: Cardiology

## 2020-09-25 ENCOUNTER — Encounter: Payer: Self-pay | Admitting: Cardiology

## 2020-09-25 VITALS — BP 160/82 | HR 86 | Ht 72.0 in | Wt 202.0 lb

## 2020-09-25 DIAGNOSIS — Q231 Congenital insufficiency of aortic valve: Secondary | ICD-10-CM

## 2020-09-25 DIAGNOSIS — I10 Essential (primary) hypertension: Secondary | ICD-10-CM | POA: Diagnosis not present

## 2020-09-25 DIAGNOSIS — I7121 Aneurysm of the ascending aorta, without rupture: Secondary | ICD-10-CM

## 2020-09-25 DIAGNOSIS — I712 Thoracic aortic aneurysm, without rupture: Secondary | ICD-10-CM | POA: Diagnosis not present

## 2020-09-25 HISTORY — DX: Aneurysm of the ascending aorta, without rupture: I71.21

## 2020-09-25 NOTE — Patient Instructions (Signed)
Medication Instructions:  Your physician recommends that you continue on your current medications as directed. Please refer to the Current Medication list given to you today.  *If you need a refill on your cardiac medications before your next appointment, please call your pharmacy*   Lab Work: None If you have labs (blood work) drawn today and your tests are completely normal, you will receive your results only by: Marland Kitchen MyChart Message (if you have MyChart) OR . A paper copy in the mail If you have any lab test that is abnormal or we need to change your treatment, we will call you to review the results.   Testing/Procedures: Your physician has requested that you have an echocardiogram. Echocardiography is a painless test that uses sound waves to create images of your heart. It provides your doctor with information about the size and shape of your heart and how well your heart's chambers and valves are working. This procedure takes approximately one hour. There are no restrictions for this procedure.  Non-Cardiac CT scanning, (CAT scanning), is a noninvasive, special x-ray that produces cross-sectional images of the body using x-rays and a computer. CT scans help physicians diagnose and treat medical conditions. For some CT exams, a contrast material is used to enhance visibility in the area of the body being studied. CT scans provide greater clarity and reveal more details than regular x-ray exams.     Follow-Up: At [facility], you and your health needs are our priority.  As part of our continuing mission to provide you with exceptional heart care, we have created designated Provider Care Teams.  These Care Teams include your primary Cardiologist (physician) and Advanced Practice Providers (APPs -  Physician Assistants and Nurse Practitioners) who all work together to provide you with the care you need, when you need it.  We recommend signing up for the patient portal called "MyChart".   Sign up information is provided on this After Visit Summary.  MyChart is used to connect with patients for Virtual Visits (Telemedicine).  Patients are able to view lab/test results, encounter notes, upcoming appointments, etc.  Non-urgent messages can be sent to your provider as well.   To learn more about what you can do with MyChart, go to [email or web address].    Your next appointment:   1 year(s)  The format for your next appointment:   In Person  Provider:   Gypsy Balsam, MD   Other Instructions   Echocardiogram An echocardiogram is a test that uses sound waves (ultrasound) to produce images of the heart. Images from an echocardiogram can provide important information about:  Heart size and shape.  The size and thickness and movement of your heart's walls.  Heart muscle function and strength.  Heart valve function or if you have stenosis. Stenosis is when the heart valves are too narrow.  If blood is flowing backward through the heart valves (regurgitation).  A tumor or infectious growth around the heart valves.  Areas of heart muscle that are not working well because of poor blood flow or injury from a heart attack.  Aneurysm detection. An aneurysm is a weak or damaged part of an artery wall. The wall bulges out from the normal force of blood pumping through the body. Tell a health care provider about:  Any allergies you have.  All medicines you are taking, including vitamins, herbs, eye drops, creams, and over-the-counter medicines.  Any blood disorders you have.  Any surgeries you have had.  Any medical conditions you  have.  Whether you are pregnant or may be pregnant. What are the risks? Generally, this is a safe test. However, problems may occur, including an allergic reaction to dye (contrast) that may be used during the test. What happens before the test? No specific preparation is needed. You may eat and drink normally. What happens during the  test?  You will take off your clothes from the waist up and put on a hospital gown.  Electrodes or electrocardiogram (ECG)patches may be placed on your chest. The electrodes or patches are then connected to a device that monitors your heart rate and rhythm.  You will lie down on a table for an ultrasound exam. A gel will be applied to your chest to help sound waves pass through your skin.  A handheld device, called a transducer, will be pressed against your chest and moved over your heart. The transducer produces sound waves that travel to your heart and bounce back (or "echo" back) to the transducer. These sound waves will be captured in real-time and changed into images of your heart that can be viewed on a video monitor. The images will be recorded on a computer and reviewed by your health care provider.  You may be asked to change positions or hold your breath for a short time. This makes it easier to get different views or better views of your heart.  In some cases, you may receive contrast through an IV in one of your veins. This can improve the quality of the pictures from your heart. The procedure may vary among health care providers and hospitals.   What can I expect after the test? You may return to your normal, everyday life, including diet, activities, and medicines, unless your health care provider tells you not to do that. Follow these instructions at home:  It is up to you to get the results of your test. Ask your health care provider, or the department that is doing the test, when your results will be ready.  Keep all follow-up visits. This is important. Summary  An echocardiogram is a test that uses sound waves (ultrasound) to produce images of the heart.  Images from an echocardiogram can provide important information about the size and shape of your heart, heart muscle function, heart valve function, and other possible heart problems.  You do not need to do anything to  prepare before this test. You may eat and drink normally.  After the echocardiogram is completed, you may return to your normal, everyday life, unless your health care provider tells you not to do that. This information is not intended to replace advice given to you by your health care provider. Make sure you discuss any questions you have with your health care provider. Document Revised: 02/19/2020 Document Reviewed: 02/19/2020 Elsevier Patient Education  2021 [employer].

## 2020-09-25 NOTE — Progress Notes (Signed)
Cardiology Office Note:    Date:  09/25/2020   ID:  Glen Young, DOB 05/07/1951, MRN [id number]  PCP:  Gordan Payment., MD  Cardiologist:  Gypsy Balsam, MD    Referring MD: Gordan Payment., MD   Chief Complaint  Patient presents with  . Follow-up  I am doing great  History of Present Illness:    Glen Young is a 70 y.o. 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  Comes today 2 months of follow-up.  Overall he is doing great he is asymptomatic, denies have any chest pain tightness squeezing pressure burning chest.  He does what he wants.  He admits that he does not exercise as much as he should but overall seems to be doing well.  Past Medical History:  Diagnosis Date  . Abnormal digital rectal exam 08/29/2020  . Anemia, chronic disease 05/26/2018   Formatting of this note might be different from the original. Mild/minimal. Fe def as well. GI negative workup in 2021.  Marland Kitchen Benign prostatic hyperplasia 11/17/2015   Formatting of this note might be different from the original. Seeing Dr. Logan Bores.  . Bicuspid aortic valve 11/17/2015  . Elevated PSA 11/17/2015   Formatting of this note might be different from the original. Evans  . Essential hypertension 11/17/2015  . Hypertrophy of inferior nasal turbinate 05/29/2018   Last Assessment & Plan:  Formatting of this note might be different from the original. Concern over nasal obstruction. Several month history of nasal congestive symptoms worse on the right side.  At times requiring Afrin therapy.  Not daily though.  No infectious sounding symptoms.  History of sinus surgery x2 in the past. EXAM shows tremendous inferior turbinates bilaterally.  After decongesting,  . Insomnia 11/17/2015  . Iron deficiency 07/08/2019   Formatting of this note might be different from the original. Negative workup in 2021.  . Microscopic  hematuria 07/08/2019   Formatting of this note might be different from the original. Evans.  . Mixed hyperlipidemia 11/17/2015  . 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 Nasal polyp 12/19/2018  . Non-sustained ventricular tachycardia (HCC) 11/17/2015  . Primary osteoarthritis involving multiple joints 12/25/2018  . Spermatocele of epididymis 05/11/2018    Past Surgical History:  Procedure Laterality Date  . APPENDECTOMY    . ARTHROTOMY Left 06/27/2018   Procedure: LEFT MIDDLE FINGER DISTAL INTERPHALANGEAL JOINT ARTHROTOMY;  Surgeon: Cindee Salt, MD;  Location: [facility];  Service: Orthopedics;  Laterality: Left;  . BACK SURGERY    . CYST EXCISION Left 06/27/2018   Procedure: EXCISION MUCOID CYST LEFT MIDDLE FINGER;  Surgeon: Cindee Salt, MD;  Location: [facility];  Service: Orthopedics;  Laterality: Left;  . LUMBAR DISC SURGERY    . MOUTH SURGERY    . NASAL SINUS SURGERY    . WISDOM TOOTH EXTRACTION      Current Medications: Current Meds  Medication Sig  . aspirin EC 81 MG tablet Take 81 mg by mouth daily.  Marland Kitchen ibuprofen (ADVIL,MOTRIN) 800 MG tablet Take 800 mg by mouth every 8 (eight) hours as needed for mild pain.  Marland Kitchen lisinopril (PRINIVIL,ZESTRIL) 10 MG tablet Take 10 mg by mouth daily.  . Multiple Vitamins-Minerals (PRESERVISION AREDS 2) CAPS Take by mouth.  . rosuvastatin (CRESTOR) 10 MG tablet Take 1 tablet by mouth daily.  . tadalafil (CIALIS) 5 MG tablet Take 5 mg  by mouth daily as needed for erectile dysfunction.  . traMADol (ULTRAM) 50 MG tablet Take 1 tablet (50 mg total) by mouth every 6 (six) hours as needed. (Patient taking differently: Take 50 mg by mouth every 6 (six) hours as needed for severe pain or moderate pain.)     Allergies:   Codeine   Social History   Socioeconomic History  . Marital status: Married    Spouse name: Not on file  . Number of children: Not on file  . Years of education: Not on file  . Highest education  level: Not on file  Occupational History  . Not on file  Tobacco Use  . Smoking status: Never Smoker  . Smokeless tobacco: Never Used  Vaping Use  . Vaping Use: Never used  Substance and Sexual Activity  . Alcohol use: Never  . Drug use: Never  . Sexual activity: Not on file  Other Topics Concern  . Not on file  Social History Narrative  . Not on file   Social Determinants of Health   Financial Resource Strain: Not on file  Food Insecurity: Not on file  Transportation Needs: Not on file  Physical Activity: Not on file  Stress: Not on file  Social Connections: Not on file     Family History: The patient's family history includes Diabetes in his maternal uncle and paternal grandmother; Hypertension in his father. ROS:   Please see the history of present illness.    All 14 point review of systems negative except as described per history of present illness  EKGs/Labs/Other Studies Reviewed:      Recent Labs: No results found for requested labs within last 8760 hours.  Recent Lipid Panel No results found for: CHOL, TRIG, HDL, CHOLHDL, VLDL, LDLCALC, LDLDIRECT  Physical Exam:    VS:  BP (!) 160/82 (BP Location: Right Arm, Patient Position: Sitting)   Pulse 86   Ht 6' (1.829 m)   Wt 202 lb (91.6 kg)   SpO2 96%   BMI 27.40 kg/m     Wt Readings from Last 3 Encounters:  09/25/20 202 lb (91.6 kg)  06/19/19 200 lb (90.7 kg)  06/27/18 193 lb 9 oz (87.8 kg)     GEN:  Well nourished, well developed in no acute distress HEENT: Normal NECK: No JVD; No carotid bruits LYMPHATICS: No lymphadenopathy CARDIAC: RRR, loud pitched diastolic murmur grade 1/6 to 2/6 best heard left border of the sternum, no rubs, no gallops RESPIRATORY:  Clear to auscultation without rales, wheezing or rhonchi  ABDOMEN: Soft, non-tender, non-distended MUSCULOSKELETAL:  No edema; No deformity  SKIN: Warm and dry LOWER EXTREMITIES: no swelling NEUROLOGIC:  Alert and oriented x 3 PSYCHIATRIC:   Normal affect   ASSESSMENT:    1. Bicuspid aortic valve   2. Essential hypertension   3. Ascending aortic aneurysm (HCC)    PLAN:    In order of problems listed above:  1. Bicuspid aortic valve, type II repeat echocardiogram.  Last echocardiogram showed preserved left ventricle ejection fraction, moderate aortic insufficiency, aortic root enlargement to 41 mm. 2. Essential hypertension.  We will recheck his blood pressure before he leaves the room.  He is taking Zestril 10 mg probably have to augment his therapy.  I did review note by primary care physician at that time his blood pressure was normal. 3. Ascending aortic aneurysm.  I will schedule him to have CT of his chest to look at the entire artery. 4. Dyslipidemia: I did review  fasting lipid profile done by primary care physician his LDL 123 HDL 61.  He is taking only Crestor 10 mg daily which I will continue.  We will make arrangements to have his cholesterol checked.  Medication Adjustments/Labs and Tests Ordered: Current medicines are reviewed at length with the patient today.  Concerns regarding medicines are outlined above.  No orders of the defined types were placed in this encounter.  Medication changes: No orders of the defined types were placed in this encounter.   Signed, Georgeanna Lea, MD, [facility] 09/25/2020 1:50 PM    [facility]

## 2020-09-26 LAB — BASIC METABOLIC PANEL
BUN/Creatinine Ratio: 15 (ref 10–24)
BUN: 15 mg/dL (ref 8–27)
CO2: 22 mmol/L (ref 20–29)
Calcium: 9 mg/dL (ref 8.6–10.2)
Chloride: 101 mmol/L (ref 96–106)
Creatinine, Ser: 1.03 mg/dL (ref 0.76–1.27)
Glucose: 106 mg/dL — ABNORMAL HIGH (ref 65–99)
Potassium: 4.4 mmol/L (ref 3.5–5.2)
Sodium: 138 mmol/L (ref 134–144)
eGFR: 78 mL/min/{1.73_m2} (ref >59)

## 2020-10-01 ENCOUNTER — Telehealth: Payer: Self-pay

## 2020-10-01 NOTE — Telephone Encounter (Signed)
Patient is returning call.  °

## 2020-10-01 NOTE — Telephone Encounter (Signed)
Patients was returning phone call for results 

## 2020-10-01 NOTE — Telephone Encounter (Signed)
Called and spoke to patient wife per dpr. Informed her of results.

## 2020-10-01 NOTE — Telephone Encounter (Signed)
Left message for patient to return call.

## 2020-10-02 ENCOUNTER — Ambulatory Visit (HOSPITAL_BASED_OUTPATIENT_CLINIC_OR_DEPARTMENT_OTHER): Payer: Medicare Other

## 2020-10-02 DIAGNOSIS — N5203 Combined arterial insufficiency and corporo-venous occlusive erectile dysfunction: Secondary | ICD-10-CM | POA: Insufficient documentation

## 2020-10-02 HISTORY — DX: Combined arterial insufficiency and corporo-venous occlusive erectile dysfunction: N52.03

## 2020-10-03 ENCOUNTER — Other Ambulatory Visit: Payer: Self-pay

## 2020-10-03 ENCOUNTER — Ambulatory Visit (HOSPITAL_BASED_OUTPATIENT_CLINIC_OR_DEPARTMENT_OTHER)
Admission: RE | Admit: 2020-10-03 | Discharge: 2020-10-03 | Disposition: A | Discharge status: Home / Self Care | Payer: Medicare Other | Source: Ambulatory Visit | Attending: Cardiology | Admitting: Cardiology

## 2020-10-03 ENCOUNTER — Encounter (HOSPITAL_BASED_OUTPATIENT_CLINIC_OR_DEPARTMENT_OTHER): Payer: Self-pay

## 2020-10-03 DIAGNOSIS — I7121 Aneurysm of the ascending aorta, without rupture: Secondary | ICD-10-CM

## 2020-10-03 DIAGNOSIS — I700 Atherosclerosis of aorta: Secondary | ICD-10-CM | POA: Insufficient documentation

## 2020-10-03 DIAGNOSIS — I712 Thoracic aortic aneurysm, without rupture: Secondary | ICD-10-CM | POA: Diagnosis present

## 2020-10-03 DIAGNOSIS — Q231 Congenital insufficiency of aortic valve: Secondary | ICD-10-CM

## 2020-10-03 DIAGNOSIS — I10 Essential (primary) hypertension: Secondary | ICD-10-CM | POA: Insufficient documentation

## 2020-10-03 MED ORDER — IOHEXOL 350 MG/ML SOLN
75.0000 mL | Freq: Once | INTRAVENOUS | Status: AC | PRN
  Administered 2020-10-03: 75 mL via INTRAVENOUS

## 2020-10-08 ENCOUNTER — Telehealth: Payer: Self-pay | Admitting: Cardiology

## 2020-10-08 NOTE — Telephone Encounter (Signed)
Patient's wife is returning call to discuss CT results. 

## 2020-10-08 NOTE — Telephone Encounter (Signed)
Called patient wife per dpr. Informed her of results.

## 2020-10-16 ENCOUNTER — Ambulatory Visit (INDEPENDENT_AMBULATORY_CARE_PROVIDER_SITE_OTHER): Payer: Medicare Other

## 2020-10-16 ENCOUNTER — Other Ambulatory Visit: Payer: Self-pay

## 2020-10-16 DIAGNOSIS — I10 Essential (primary) hypertension: Secondary | ICD-10-CM

## 2020-10-16 DIAGNOSIS — I712 Thoracic aortic aneurysm, without rupture: Secondary | ICD-10-CM | POA: Diagnosis not present

## 2020-10-16 DIAGNOSIS — Q231 Congenital insufficiency of aortic valve: Secondary | ICD-10-CM

## 2020-10-16 DIAGNOSIS — I7121 Aneurysm of the ascending aorta, without rupture: Secondary | ICD-10-CM

## 2020-10-16 LAB — ECHOCARDIOGRAM COMPLETE
Area-P 1/2: 3.77 cm2
P 1/2 time: 368 msec
S' Lateral: 3.94 cm

## 2020-10-16 NOTE — Progress Notes (Signed)
Complete echocardiogram performed.  Jimmy Hasset Chaviano RDCS, RVT  

## 2020-10-17 ENCOUNTER — Telehealth: Payer: Self-pay | Admitting: Cardiology

## 2020-10-17 NOTE — Telephone Encounter (Signed)
Patient returning call for echo results. 

## 2020-10-17 NOTE — Telephone Encounter (Signed)
Called patient. Informed him of results.  

## 2021-08-21 ENCOUNTER — Other Ambulatory Visit: Payer: Self-pay | Admitting: *Deleted

## 2021-08-21 ENCOUNTER — Ambulatory Visit (INDEPENDENT_AMBULATORY_CARE_PROVIDER_SITE_OTHER): Payer: Medicare Other | Admitting: Sports Medicine

## 2021-08-21 ENCOUNTER — Encounter: Payer: Self-pay | Admitting: Sports Medicine

## 2021-08-21 ENCOUNTER — Ambulatory Visit: Payer: Medicare Other

## 2021-08-21 DIAGNOSIS — M2042 Other hammer toe(s) (acquired), left foot: Secondary | ICD-10-CM

## 2021-08-21 DIAGNOSIS — Q828 Other specified congenital malformations of skin: Secondary | ICD-10-CM | POA: Diagnosis not present

## 2021-08-21 DIAGNOSIS — M216X2 Other acquired deformities of left foot: Secondary | ICD-10-CM

## 2021-08-21 DIAGNOSIS — M79672 Pain in left foot: Secondary | ICD-10-CM | POA: Diagnosis not present

## 2021-08-21 NOTE — Progress Notes (Signed)
Subjective: Glen Young is a 71 y.o. male patient who presents to office for evaluation of left foot pain secondary to hard skin on the ball of the foot states that it hurts when he goes barefoot but has no pain when he wear shoes it feels like something is poking or sticking him in the bottom of the foot reports a remote history of a disc injury at L5 and ever since has had numbness on the left leg and states that he does notice that the circulation looks a little bit different on the left as far as the color states that the injury happened in 1988.  Patient denies any new or recurrent injury since then.  No other pedal complaints noted.  Patient Active Problem List   Diagnosis Date Noted   Combined arterial insufficiency and corporo-venous occlusive erectile dysfunction 10/02/2020   Ascending aortic aneurysm 09/25/2020   Mucoid cyst of joint    Abnormal digital rectal exam 08/29/2020   Iron deficiency 07/08/2019   Microscopic hematuria 07/08/2019   History of iron deficiency 07/08/2019   Primary osteoarthritis involving multiple joints 12/25/2018   Nasal polyp 12/19/2018   Hypertrophy of inferior nasal turbinate 05/29/2018   Anemia, chronic disease 05/26/2018   History of anemia of chronic disease 05/26/2018   Spermatocele of epididymis 05/11/2018   Bicuspid aortic valve 11/17/2015   Essential hypertension 11/17/2015   Insomnia 11/17/2015   Mixed hyperlipidemia 11/17/2015   Non-sustained ventricular tachycardia 11/17/2015   Benign prostatic hyperplasia 11/17/2015   Elevated PSA 11/17/2015   Thrombocytopenia ([place]) 11/17/2015    Current Outpatient Medications on File Prior to Visit  Medication Sig Dispense Refill   lisinopril (ZESTRIL) 20 MG tablet Take 1 tablet by mouth daily.     aspirin EC 81 MG tablet Take 81 mg by mouth daily.     ibuprofen (ADVIL,MOTRIN) 800 MG tablet Take 800 mg by mouth every 8 (eight) hours as needed for mild pain.     Multiple Vitamins-Minerals  (PRESERVISION AREDS 2) CAPS Take by mouth.     rosuvastatin (CRESTOR) 10 MG tablet Take 1 tablet by mouth daily.     tadalafil (CIALIS) 5 MG tablet Take 5 mg by mouth daily as needed for erectile dysfunction.     traMADol (ULTRAM) 50 MG tablet Take 1 tablet (50 mg total) by mouth every 6 (six) hours as needed. (Patient taking differently: Take 50 mg by mouth every 6 (six) hours as needed for severe pain or moderate pain.) 20 tablet 0   No current facility-administered medications on file prior to visit.    Allergies  Allergen Reactions   Codeine Nausea And Vomiting    Objective:  General: Alert and oriented x3 in no acute distress  Dermatology: Keratotic lesion present sub met 5 on left with skin lines transversing the lesion, pain is present with direct pressure to the lesion with a central nucleated core noted, no webspace macerations, no ecchymosis bilateral, all nails x 10 are well manicured.  Vascular: Dorsalis Pedis and Posterior Tibial pedal pulses 1/4, Capillary Fill Time 3 seconds, + pedal hair growth bilateral, no edema bilateral lower extremities, significant varicosities noted left greater than right.  Temperature gradient within normal limits.  Neurology: Johney Maine sensation intact via light touch bilateral.  Musculoskeletal: Mild tenderness with palpation at the keratotic lesion site left foot with prominent fifth metatarsal head left greater than right and fat pad atrophy noted bilateral.  Muscular strength 5/5 in all groups without pain or limitation on range of  motion.   Assessment and Plan: Problem List Items Addressed This Visit   None Visit Diagnoses     Prominent metatarsal head, left    -  Primary   Porokeratosis       Left foot pain           -Complete examination performed -Discussed treatment options -Parred keratoic lesion x1 at the left sub 5 using a15 blade; treated the area with Catherone covered with Band-Aid and advised patient on possible  blistering/skin peeling reaction -Encouraged daily skin emollients -Advised good supportive shoes and inserts and advised patient of this works well may benefit long-term from custom insoles -Advised patient that likely his nerve damage in the left leg is affecting the circulation and causing his varicose veins to be worse encouraged elevation and if veins worsen he may benefit from a referral to vein and vascular however at this time we will monitor -Patient to return to office as needed or sooner if condition worsens.  Landis Martins, DPM

## 2021-10-27 ENCOUNTER — Encounter: Payer: Self-pay | Admitting: Cardiology

## 2021-10-27 ENCOUNTER — Ambulatory Visit (INDEPENDENT_AMBULATORY_CARE_PROVIDER_SITE_OTHER): Payer: Medicare Other | Admitting: Cardiology

## 2021-10-27 VITALS — BP 150/80 | HR 89 | Ht 72.0 in | Wt 198.6 lb

## 2021-10-27 DIAGNOSIS — I351 Nonrheumatic aortic (valve) insufficiency: Secondary | ICD-10-CM

## 2021-10-27 DIAGNOSIS — I10 Essential (primary) hypertension: Secondary | ICD-10-CM | POA: Diagnosis not present

## 2021-10-27 DIAGNOSIS — Q231 Congenital insufficiency of aortic valve: Secondary | ICD-10-CM

## 2021-10-27 DIAGNOSIS — E785 Hyperlipidemia, unspecified: Secondary | ICD-10-CM

## 2021-10-27 DIAGNOSIS — I7121 Aneurysm of the ascending aorta, without rupture: Secondary | ICD-10-CM

## 2021-10-27 HISTORY — DX: Nonrheumatic aortic (valve) insufficiency: I35.1

## 2021-10-27 MED ORDER — AMLODIPINE BESYLATE 5 MG PO TABS: 5.0000 mg | ORAL_TABLET | Freq: Every day | ORAL | 0 refills | Status: DC

## 2021-10-27 NOTE — Addendum Note (Signed)
Addended by: Baldo Ash D on: 10/27/2021 10:38 AM ? ? Modules accepted: Orders ? ?

## 2021-10-27 NOTE — Progress Notes (Signed)
?Cardiology Office Note:   ? ?Date:  10/27/2021  ? ?ID:  Glen Young, DOB Oct 16, 1950, MRN [id number] ? ?PCP:  Gordan Payment., MD  ?Cardiologist:  Gypsy Balsam, MD   ? ?Referring MD: Gordan Payment., MD  ? ?Chief Complaint  ?Patient presents with  ? Follow-up  ?Doing fine ? ?History of Present Illness:   ? ?Glen Young is a 71 y.o. male with past medical history significant for questionable bicuspid aortic valve, mild enlargement of the ascending aorta, essential hypertension, dyslipidemia.  He comes today 2 months of follow-up.  Overall he is doing very well.  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 ? ?Past Medical History:  ?Diagnosis Date  ? Abnormal digital rectal exam 08/29/2020  ? Anemia, chronic disease 05/26/2018  ? Formatting of this note might be different from the original. Mild/minimal. Fe def as well. GI negative workup in 2021.  ? Benign prostatic hyperplasia 11/17/2015  ? Formatting of this note might be different from the original. Seeing Dr. Logan Bores.  ? Bicuspid aortic valve 11/17/2015  ? Elevated PSA 11/17/2015  ? Formatting of this note might be different from the original. Logan Bores  ? Essential hypertension 11/17/2015  ? Hypertrophy of inferior nasal turbinate 05/29/2018  ? Last Assessment & Plan:  Formatting of this note might be different from the original. Concern over nasal obstruction. Several month history of nasal congestive symptoms worse on the right side.  At times requiring Afrin therapy.  Not daily though.  No infectious sounding symptoms.  History of sinus surgery x2 in the past. EXAM shows tremendous inferior turbinates bilaterally.  After decongesting,  ? Insomnia 11/17/2015  ? Iron deficiency 07/08/2019  ? Formatting of this note might be different from the original. Negative workup in 2021.  ? Microscopic hematuria 07/08/2019  ? Formatting of this note might be different from the original. Evans.  ? Mixed  hyperlipidemia 11/17/2015  ? Mucoid cyst of joint   ? left middle finger  ? Nasal polyp 12/19/2018  ? Non-sustained ventricular tachycardia (HCC) 11/17/2015  ? 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 Spermatocele of epididymis 05/11/2018  ? ? ?Past Surgical History:  ?Procedure Laterality Date  ? APPENDECTOMY    ? ARTHROTOMY Left 06/27/2018  ? Procedure: LEFT MIDDLE FINGER DISTAL INTERPHALANGEAL JOINT ARTHROTOMY;  Surgeon: Cindee Salt, MD;  Location: [facility];  Service: Orthopedics;  Laterality: Left;  ? BACK SURGERY    ? CYST EXCISION Left 06/27/2018  ? Procedure: EXCISION MUCOID CYST LEFT MIDDLE FINGER;  Surgeon: Cindee Salt, MD;  Location: [facility];  Service: Orthopedics;  Laterality: Left;  ? LUMBAR DISC SURGERY    ? MOUTH SURGERY    ? NASAL SINUS SURGERY    ? WISDOM TOOTH EXTRACTION    ? ? ?Current Medications: ?Current Meds  ?Medication Sig  ? aspirin EC 81 MG tablet Take 81 mg by mouth daily.  ? ibuprofen (ADVIL,MOTRIN) 800 MG tablet Take 800 mg by mouth every 8 (eight) hours as needed for mild pain.  ? lisinopril (ZESTRIL) 20 MG tablet Take 1 tablet by mouth daily.  ? Multiple Vitamins-Minerals (PRESERVISION AREDS 2) CAPS Take 1 tablet by mouth daily.  ? rosuvastatin (CRESTOR) 10 MG tablet Take 1 tablet by mouth daily.  ? traMADol (ULTRAM) 50 MG tablet Take 1 tablet (50 mg total) by mouth every 6 (six) hours as needed. (Patient taking differently: Take  50 mg by mouth every 6 (six) hours as needed for severe pain or moderate pain.)  ?  ? ?Allergies:   Codeine  ? ?Social History  ? ?Socioeconomic History  ? Marital status: Married  ?  Spouse name: Not on file  ? Number of children: Not on file  ? Years of education: Not on file  ? Highest education level: Not on file  ?Occupational History  ? Not on file  ?Tobacco Use  ? Smoking status: Never  ? Smokeless tobacco: Never  ?Vaping Use  ? Vaping Use: Never used  ?Substance and Sexual Activity  ? Alcohol use: Never   ? Drug use: Never  ? Sexual activity: Not on file  ?Other Topics Concern  ? Not on file  ?Social History Narrative  ? Not on file  ? ?Social Determinants of Health  ? ?Financial Resource Strain: Not on file  ?Food Insecurity: Not on file  ?Transportation Needs: Not on file  ?Physical Activity: Not on file  ?Stress: Not on file  ?Social Connections: Not on file  ?  ? ?Family History: ?The patient's family history includes Diabetes in his maternal uncle and paternal grandmother; Hypertension in his father. ?ROS:   ?Please see the history of present illness.    ?All 14 point review of systems negative except as described per history of present illness ? ?EKGs/Labs/Other Studies Reviewed:   ? ? ? ?Recent Labs: ?No results found for requested labs within last 8760 hours.  ?Recent Lipid Panel ?No results found for: CHOL, TRIG, HDL, CHOLHDL, VLDL, LDLCALC, LDLDIRECT ? ?Physical Exam:   ? ?VS:  BP (!) 150/80 (BP Location: Left Arm, Patient Position: Sitting)   Pulse 89   Ht 6' (1.829 m)   Wt 198 lb 9.6 oz (90.1 kg)   SpO2 99%   BMI 26.94 kg/m?    ? ?Wt Readings from Last 3 Encounters:  ?10/27/21 198 lb 9.6 oz (90.1 kg)  ?09/25/20 202 lb (91.6 kg)  ?06/19/19 200 lb (90.7 kg)  ?  ? ?GEN:  Well nourished, well developed in no acute distress ?HEENT: Normal ?NECK: No JVD; No carotid bruits ?LYMPHATICS: No lymphadenopathy ?CARDIAC: RRR, no murmurs, no rubs, no gallops ?RESPIRATORY:  Clear to auscultation without rales, wheezing or rhonchi  ?ABDOMEN: Soft, non-tender, non-distended ?MUSCULOSKELETAL:  No edema; No deformity  ?SKIN: Warm and dry ?LOWER EXTREMITIES: no swelling ?NEUROLOGIC:  Alert and oriented x 3 ?PSYCHIATRIC:  Normal affect  ? ?ASSESSMENT:   ? ?1. Bicuspid aortic valve   ?2. Nonrheumatic aortic valve insufficiency   ?3. Essential hypertension   ?4. Aneurysm of ascending aorta without rupture (HCC)   ? ?PLAN:   ? ?In order of problems listed above: ? ?Bicuspid aortic valve questionable diagnosis if it is truly  bicuspid aortic valve he should have it or significant stenosis significant regurgitation with his age but he does not I will ask him to have another echocardiogram done to look at the degree of aortic insufficiency.  Also will check the size of the ascending aorta ?Aortic insufficiency: Will check echocardiogram. ?Essential hypertension: Uncontrolled, will add amlodipine 5 mg daily to his medical regimen. ?Dyslipidemia I did review blood test done by primary care physician, his LDL was 197!Marland Kitchen.  He is Crestor has been increased to 20 mg daily.  We will check direct LDL today ?Enlargement of the aorta measuring 39 mm by CT which is considered not significant enlargement however echocardiogram reports 41 to 42 mm.  We will do another  echocardiogram to look into it ? ? ?Medication Adjustments/Labs and Tests Ordered: ?Current medicines are reviewed at length with the patient today.  Concerns regarding medicines are outlined above.  ?No orders of the defined types were placed in this encounter. ? ?Medication changes: No orders of the defined types were placed in this encounter. ? ? ?Signed, ?Georgeanna Lea, MD, [facility] ?10/27/2021 10:26 AM    ?[facility] ?

## 2021-10-27 NOTE — Patient Instructions (Signed)
Medication Instructions:  ?Your physician has recommended you make the following change in your medication:  ?START: AMLODIPINE 5mg  once daily by mouth  ? ?*If you need a refill on your cardiac medications before your next appointment, please call your pharmacy* ? ? ?Lab Work: ? ?Your physician recommends that you have a direct LDL today  ? ?If you have labs (blood work) drawn today and your tests are completely normal, you will receive your results only by: ?MyChart Message (if you have MyChart) OR ?A paper copy in the mail ?If you have any lab test that is abnormal or we need to change your treatment, we will call you to review the results. ? ? ?Testing/Procedures: ?Your physician has requested that you have an echocardiogram. Echocardiography is a painless test that uses sound waves to create images of your heart. It provides your doctor with information about the size and shape of your heart and how well your heart?s chambers and valves are working. This procedure takes approximately one hour. There are no restrictions for this procedure.  ? ? ?Follow-Up: ?At [facility], you and your health needs are our priority.  As part of our continuing mission to provide you with exceptional heart care, we have created designated Provider Care Teams.  These Care Teams include your primary Cardiologist (physician) and Advanced Practice Providers (APPs -  Physician Assistants and Nurse Practitioners) who all work together to provide you with the care you need, when you need it. ? ?We recommend signing up for the patient portal called "MyChart".  Sign up information is provided on this After Visit Summary.  MyChart is used to connect with patients for Virtual Visits (Telemedicine).  Patients are able to view lab/test results, encounter notes, upcoming appointments, etc.  Non-urgent messages can be sent to your provider as well.   ?To learn more about what you can do with MyChart, go to [facility].   ? ?Your  next appointment:   ?6 month(s) ? ?The format for your next appointment:   ?In Person ? ?Provider:   ?[email or web address], MD  ? ? ?Other Instructions ?None ? ?Important Information About Sugar ? ? ? ? ?  ?

## 2021-10-28 LAB — LDL CHOLESTEROL, DIRECT: LDL Direct: 95 mg/dL (ref 0–99)

## 2021-11-09 ENCOUNTER — Ambulatory Visit (INDEPENDENT_AMBULATORY_CARE_PROVIDER_SITE_OTHER): Payer: Medicare Other

## 2021-11-09 DIAGNOSIS — I351 Nonrheumatic aortic (valve) insufficiency: Secondary | ICD-10-CM

## 2021-11-09 DIAGNOSIS — I10 Essential (primary) hypertension: Secondary | ICD-10-CM

## 2021-11-09 LAB — ECHOCARDIOGRAM COMPLETE
Area-P 1/2: 2.91 cm2
P 1/2 time: 280 msec
S' Lateral: 4.1 cm

## 2022-02-02 ENCOUNTER — Other Ambulatory Visit: Payer: Self-pay | Admitting: Cardiology

## 2022-03-11 DIAGNOSIS — J329 Chronic sinusitis, unspecified: Secondary | ICD-10-CM | POA: Insufficient documentation

## 2022-03-11 HISTORY — DX: Chronic sinusitis, unspecified: J32.9

## 2022-04-23 IMAGING — MR MR PROSTATE WO/W CM
12 series · 48 of 48 positions shown · IV contrast (18ml multihance)
Comparison: None.

CLINICAL DATA: 7-year-old male with elevated PSA. PSA equal
08/21/2020.

EXAM:
MR PROSTATE WITHOUT AND WITH CONTRAST
TECHNIQUE: Multiplanar multisequence MRI images were obtained of the pelvis
centered about the prostate. Pre and post contrast images were
obtained.
CONTRAST:  18mL MULTIHANCE GADOBENATE DIMEGLUMINE 529 MG/ML IV SOLN

[Series 3: T2 · coronal · 3.0mm · 0.56mm/px · 1 of 23 slices shown (1 of 3)]
[im 1/23]
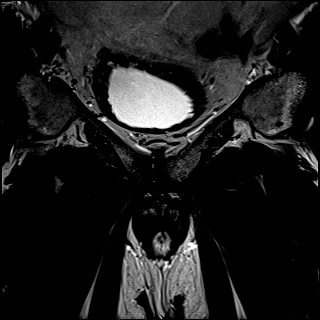

[Series 4: T1 · axial · 5.0mm · 1.25mm/px · 1 of 80 slices shown]
[im 1/80]
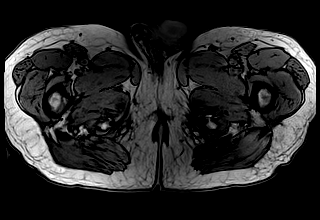

[Series 5: DWI · axial · 3.0mm · 1.75mm/px · z∈[+3,+66]mm · 2 of 66 slices shown (1 of 3)]
[im 1/66]
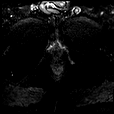
[im 66/66]
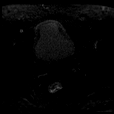

[Series 6: DWI · axial · 3.0mm · 1.75mm/px · 1 of 22 slices shown (2 of 3)]
[im 1/22]
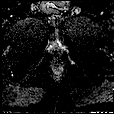

[Series 7: DWI · axial · 3.0mm · 1.75mm/px · 1 of 22 slices shown (3 of 3)]
[im 1/22]
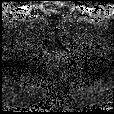

[Series 8: T2 · axial · 3.0mm · 0.56mm/px · 1 of 23 slices shown (2 of 3)]
[im 1/23]
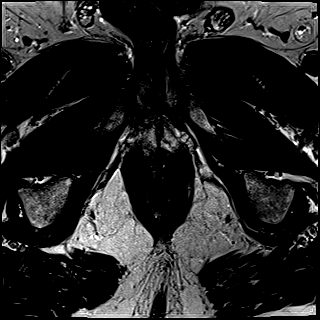

[Series 9: T2 · axial · 1.0mm · 1.04mm/px · z∈[-1,+70]mm · 2 of 72 slices shown (3 of 3)]
[im 1/72]
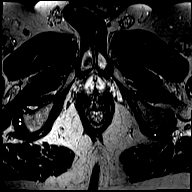
[im 72/72]
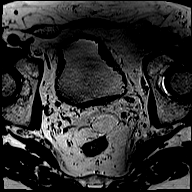

[Series 10: pre t1_twist_tra_dyn · axial · non-contrast · 3.5mm · 0.83mm/px · 1 of 20 slices shown]
[im 1/20]
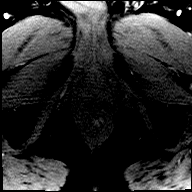

[Series 11: post t1_twist_tra_dyn-copy center · axial · non-contrast · 3.5mm · 0.83mm/px · z∈[+1,+68]mm · 17 of 600 slices shown]
[im 1/600]
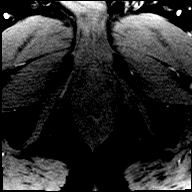
[im 38/600]
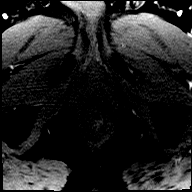
[im 75/600]
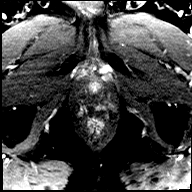
[im 113/600]
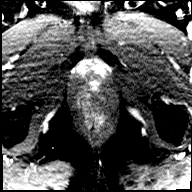
[im 150/600]
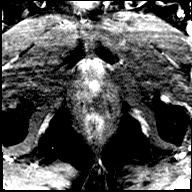
[im 188/600]
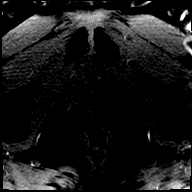
[im 225/600]
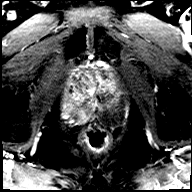
[im 263/600]
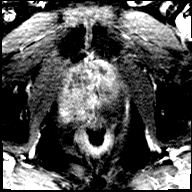
[im 300/600]
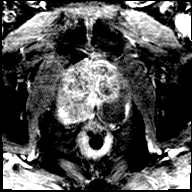
[im 337/600]
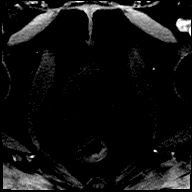
[im 375/600]
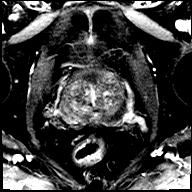
[im 412/600]
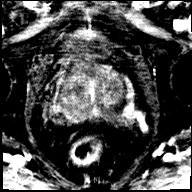
[im 450/600]
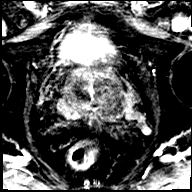
[im 487/600]
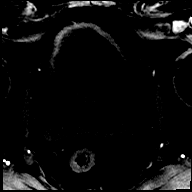
[im 525/600]
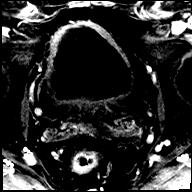
[im 562/600]
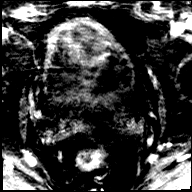
[im 600/600]
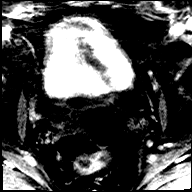

[Series 12: post t1_twist_tra_dyn-copy cent_sub · axial · 3.5mm · 0.83mm/px · z∈[+1,+68]mm · 17 of 576 slices shown]
[im 1/576]
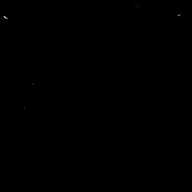
[im 36/576]
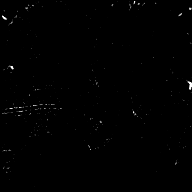
[im 72/576]
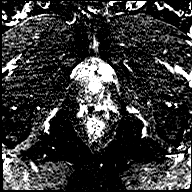
[im 108/576]
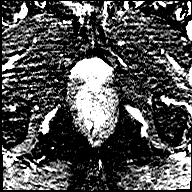
[im 144/576]
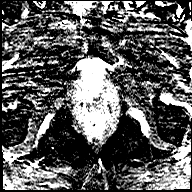
[im 180/576]
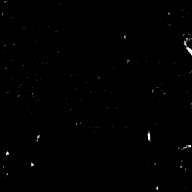
[im 216/576]
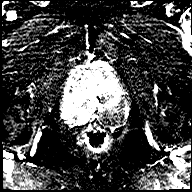
[im 252/576]
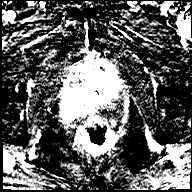
[im 288/576]
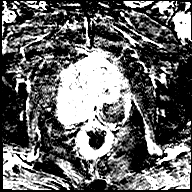
[im 324/576]
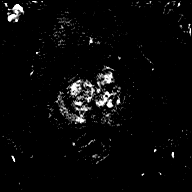
[im 360/576]
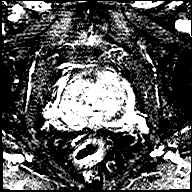
[im 396/576]
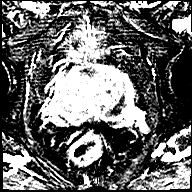
[im 432/576]
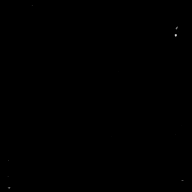
[im 468/576]
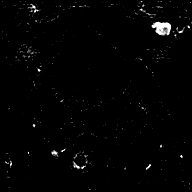
[im 504/576]
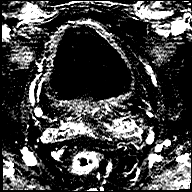
[im 540/576]
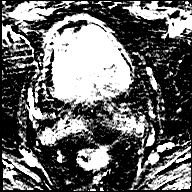
[im 576/576]
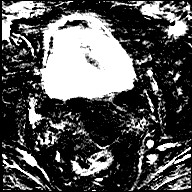

[Series 13: t1_vibe_dixon_tra_f · axial · 2.5mm · 0.91mm/px · z∈[-31,+166]mm · 2 of 80 slices shown]
[im 1/80]
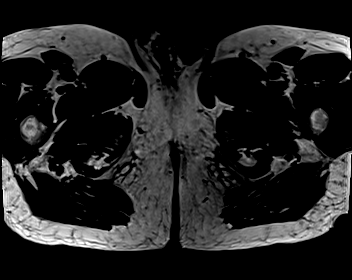
[im 80/80]
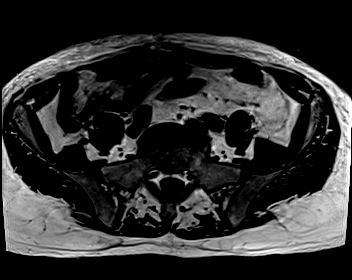

[Series 14: t1_vibe_dixon_tra_w · axial · 2.5mm · 0.91mm/px · z∈[-31,+166]mm · 2 of 80 slices shown]
[im 1/80]
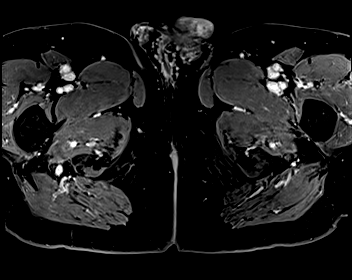
[im 80/80]
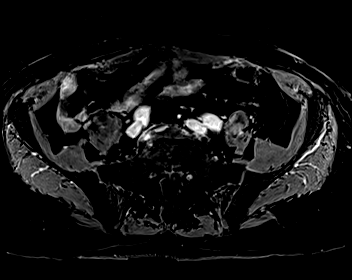

[48 of 48 positions shown; findings below may reference images not displayed]

FINDINGS: Prostate: There is a region of low signal intensity within the
peripheral zone on T2 weighted imaging in the posterior RIGHT apex
measuring 10 mm x 8 mm (image [DATE]). There is subtle restricted
diffusion at this level (image 14/series 6).

Second region of decreased signal intensity within the RIGHT lobe of
prostate gland in the lateral mid segment measuring 9 mm x 7 mm on
image [DATE] which also has very mild restricted diffusion

Normal high signal intensity within the peripheral zone LEFT gland
on T2 weighted imaging

On delayed on postcontrast imaging there is nonspecific enhancement
through the RIGHT peripheral zone.

The prostatic capsule loses some distinctness along the posterior
RIGHT apex (image 41/9). No clear transscapular spread. Recto
prostatic angles are sharp. Neurovascular bundles appear uninvolved
involved. Some vesicles appear normal.

Volume: 4.0 x 6.4 x 4.7 cm (volume = 63 cm^3)

Transcapsular spread:  No clear transscapular spread as above.

Seminal vesicle involvement: Absent

Neurovascular bundle involvement: Absent

Pelvic adenopathy: Absent

Bone metastasis: Absent

Other findings: None
IMPRESSION: 1. Lesion in the RIGHT lobe peripheral zone in the posterior apical
segment is concerning for high-grade carcinoma. PI-RADS: 4. (Dynacad
3D post processing performed). BANI #1.
2. Lesion in the RIGHT lobe peripheral zone in the lateral mid
segment is concerning for high-grade carcinoma. PI-RADS: 4. (Dynacad
3D post processing performed). BANI #2.
3. No clear transscapular spread of tumor. Some indistinctness along
the RIGHT posterior apex.
4. No lymphadenopathy.  Seminal vesicles normal.

## 2022-05-04 ENCOUNTER — Ambulatory Visit: Payer: Medicare Other | Attending: Cardiology | Admitting: Cardiology

## 2022-05-04 ENCOUNTER — Encounter: Payer: Self-pay | Admitting: Cardiology

## 2022-05-04 VITALS — BP 150/70 | HR 81 | Ht 74.0 in | Wt 199.2 lb

## 2022-05-04 DIAGNOSIS — I7121 Aneurysm of the ascending aorta, without rupture: Secondary | ICD-10-CM | POA: Diagnosis not present

## 2022-05-04 DIAGNOSIS — Q231 Congenital insufficiency of aortic valve: Secondary | ICD-10-CM | POA: Diagnosis present

## 2022-05-04 DIAGNOSIS — I10 Essential (primary) hypertension: Secondary | ICD-10-CM | POA: Insufficient documentation

## 2022-05-04 DIAGNOSIS — I351 Nonrheumatic aortic (valve) insufficiency: Secondary | ICD-10-CM | POA: Insufficient documentation

## 2022-05-04 MED ORDER — AMLODIPINE BESYLATE 10 MG PO TABS: 10.0000 mg | ORAL_TABLET | Freq: Every day | ORAL | 3 refills | Status: DC

## 2022-05-04 NOTE — Addendum Note (Signed)
Addended by: Jacobo Forest D on: 05/04/2022 10:48 AM   Modules accepted: Orders

## 2022-05-04 NOTE — Patient Instructions (Addendum)
Medication Instructions:  Your physician has recommended you make the following change in your medication:   INCREASE: Amlodipine 10mg  1 tablet daily    Lab Work: None Ordered If you have labs (blood work) drawn today and your tests are completely normal, you will receive your results only by: [place] (if you have MyChart) OR A paper copy in the mail If you have any lab test that is abnormal or we need to change your treatment, we will call you to review the results.   Testing/Procedures: None Ordered   Follow-Up: At [facility], you and your health needs are our priority.  As part of our continuing mission to provide you with exceptional heart care, we have created designated Provider Care Teams.  These Care Teams include your primary Cardiologist (physician) and Advanced Practice Providers (APPs -  Physician Assistants and Nurse Practitioners) who all work together to provide you with the care you need, when you need it.  We recommend signing up for the patient portal called "MyChart".  Sign up information is provided on this After Visit Summary.  MyChart is used to connect with patients for Virtual Visits (Telemedicine).  Patients are able to view lab/test results, encounter notes, upcoming appointments, etc.  Non-urgent messages can be sent to your provider as well.   To learn more about what you can do with MyChart, go to [email or web address].    Your next appointment:   6 month(s)  The format for your next appointment:   In Person  Provider:   Jenne Campus, MD    Other Instructions NA

## 2022-05-04 NOTE — Progress Notes (Signed)
Cardiology Office Note:    Date:  05/04/2022   ID:  Glen Young, DOB 03/03/1951, MRN [id number]  PCP:  Raina Mina., MD  Cardiologist:  Jenne Campus, MD    Referring MD: Raina Mina., MD   Chief Complaint  Patient presents with   Follow-up   Medication Management    Want to discuss meds     History of Present Illness:    Glen Young is a 71 y.o. male with past medical history significant for questionable bicuspid arctic valve, mild aortic insufficiency, no aortic stenosis, mild ascending aortic aneurysm measuring 40 mm, essential hypertension, cataract.  He comes today to my office for follow-up.  He complained of having some chest pain he described tingling that happened in the left side of his chest not related to exercise.  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 Mild/minimal. Fe def as well. GI negative workup in 2021.   Benign prostatic hyperplasia 11/17/2015   Formatting of this note might be different from the original. Seeing Dr. Amalia Hailey.   Bicuspid aortic valve 11/17/2015   Elevated PSA 11/17/2015   Formatting of this note might be different from the original. [place]   Essential hypertension 11/17/2015   Hypertrophy of inferior nasal turbinate 05/29/2018   Last Assessment & Plan:  Formatting of this note might be different from the original. Concern over nasal obstruction. Several month history of nasal congestive symptoms worse on the right side.  At times requiring Afrin therapy.  Not daily though.  No infectious sounding symptoms.  History of sinus surgery x2 in the past. EXAM shows tremendous inferior turbinates bilaterally.  After decongesting,   Insomnia 11/17/2015   Iron deficiency 07/08/2019    Formatting of this note might be different from the original. Negative workup in 2021.   Microscopic hematuria 07/08/2019   Formatting of this note might be different from the original. [place].   Mixed hyperlipidemia 11/17/2015   Mucoid cyst of joint    left middle finger   Nasal polyp 12/19/2018   Non-sustained ventricular tachycardia ([place]) 11/17/2015   Primary osteoarthritis involving multiple joints 12/25/2018   Spermatocele of epididymis 05/11/2018    Past Surgical History:  Procedure Laterality Date   APPENDECTOMY     ARTHROTOMY Left 06/27/2018   Procedure: LEFT MIDDLE FINGER DISTAL INTERPHALANGEAL JOINT ARTHROTOMY;  Surgeon: Daryll Brod, MD;  Location: [facility];  Service: Orthopedics;  Laterality: Left;   BACK SURGERY     CYST EXCISION Left 06/27/2018   Procedure: EXCISION MUCOID CYST LEFT MIDDLE FINGER;  Surgeon: Daryll Brod, MD;  Location: [facility];  Service: Orthopedics;  Laterality: Left;   LUMBAR DISC SURGERY     MOUTH SURGERY     NASAL SINUS SURGERY     WISDOM TOOTH EXTRACTION      Current Medications: Current Meds  Medication Sig   aspirin EC 81 MG tablet Take 81 mg by mouth daily.   ibuprofen (ADVIL,MOTRIN) 800 MG tablet Take 800 mg by mouth every 8 (eight) hours as needed for mild pain.   lisinopril (ZESTRIL) 20 MG tablet Take 1 tablet by mouth daily.   Multiple Vitamins-Minerals (PRESERVISION AREDS 2) CAPS Take 1 tablet  by mouth daily.   rosuvastatin (CRESTOR) 10 MG tablet Take 1 tablet by mouth daily.   traMADol (ULTRAM) 50 MG tablet Take 1 tablet (50 mg total) by mouth every 6 (six) hours as needed. (Patient taking differently: Take 50 mg by mouth every 6 (six) hours as needed for severe pain or moderate pain.)     Allergies:   Codeine   Social History   Socioeconomic History   Marital status: Married    Spouse name: Not on file   Number of children: Not on file   Years of education: Not on file   Highest education level:  Not on file  Occupational History   Not on file  Tobacco Use   Smoking status: Never   Smokeless tobacco: Never  Vaping Use   Vaping Use: Never used  Substance and Sexual Activity   Alcohol use: Never   Drug use: Never   Sexual activity: Not on file  Other Topics Concern   Not on file  Social History Narrative   Not on file   Social Determinants of Health   Financial Resource Strain: Not on file  Food Insecurity: Not on file  Transportation Needs: Not on file  Physical Activity: Not on file  Stress: Not on file  Social Connections: Not on file     Family History: The patient's family history includes Diabetes in his maternal uncle and paternal grandmother; Hypertension in his father. ROS:   Please see the history of present illness.    All 14 point review of systems negative except as described per history of present illness  EKGs/Labs/Other Studies Reviewed:      Recent Labs: No results found for requested labs within last 365 days.  Recent Lipid Panel    Component Value Date/Time   LDLDIRECT 95 10/27/2021 1043    Physical Exam:    VS:  BP (!) 150/70 (BP Location: Left Arm, Patient Position: Sitting)   Pulse 81   Ht 6\' 2"  (1.88 m)   Wt 199 lb 3.2 oz (90.4 kg)   BMI 25.58 kg/m     Wt Readings from Last 3 Encounters:  05/04/22 199 lb 3.2 oz (90.4 kg)  10/27/21 198 lb 9.6 oz (90.1 kg)  09/25/20 202 lb (91.6 kg)     GEN:  Well nourished, well developed in no acute distress HEENT: Normal NECK: No JVD; No carotid bruits LYMPHATICS: No lymphadenopathy CARDIAC: RRR, soft stock murmur grade 1/6 best heard left border of the sternum, no rubs, no gallops RESPIRATORY:  Clear to auscultation without rales, wheezing or rhonchi  ABDOMEN: Soft, non-tender, non-distended MUSCULOSKELETAL:  No edema; No deformity  SKIN: Warm and dry LOWER EXTREMITIES: no swelling NEUROLOGIC:  Alert and oriented x 3 PSYCHIATRIC:  Normal affect   ASSESSMENT:    1. Nonrheumatic  aortic valve insufficiency   2. Bicuspid aortic valve   3. Essential hypertension   4. Aneurysm of ascending aorta without rupture (HCC)    PLAN:    In order of problems listed above:  Aortic valve insufficiency only mild.  No intervention needed we will continue follow-up. Bicuspid aortic valve which I think is questionable diagnosis since if somebody truly have bicuspid arctic valve should have significant stenosis or regurgitation by now.  Regardless, continue monitoring, no indication for intervention now. Essential hypertension: Uncontrolled, I will increase dose of amlodipine to 10 mg daily, I warned her about potential side effects with swelling of lower extremities he understand he will try to comply. Ascending  arctic aneurysm.  40 mm.  No need to intervene we will follow-up periodically, blood pressure need to be controlled.   Medication Adjustments/Labs and Tests Ordered: Current medicines are reviewed at length with the patient today.  Concerns regarding medicines are outlined above.  No orders of the defined types were placed in this encounter.  Medication changes: No orders of the defined types were placed in this encounter.   Signed, Park Liter, MD, [facility] 05/04/2022 10:36 AM    [place]

## 2022-12-09 ENCOUNTER — Other Ambulatory Visit: Payer: Self-pay

## 2022-12-09 DIAGNOSIS — R972 Elevated prostate specific antigen [PSA]: Secondary | ICD-10-CM

## 2022-12-15 ENCOUNTER — Other Ambulatory Visit: Payer: Self-pay | Admitting: Urology

## 2022-12-15 DIAGNOSIS — R972 Elevated prostate specific antigen [PSA]: Secondary | ICD-10-CM

## 2023-01-25 ENCOUNTER — Ambulatory Visit
Admission: RE | Admit: 2023-01-25 | Discharge: 2023-01-25 | Disposition: A | Discharge status: Home / Self Care | Payer: Medicare Other | Source: Ambulatory Visit | Attending: Urology | Admitting: Urology

## 2023-01-25 DIAGNOSIS — R972 Elevated prostate specific antigen [PSA]: Secondary | ICD-10-CM

## 2023-01-25 HISTORY — DX: Malignant (primary) neoplasm, unspecified: C80.1

## 2023-01-25 MED ORDER — GADOPICLENOL 0.5 MMOL/ML IV SOLN
9.0000 mL | Freq: Once | INTRAVENOUS | Status: AC | PRN
  Administered 2023-01-25: 9 mL via INTRAVENOUS

## 2023-03-02 ENCOUNTER — Encounter: Payer: Self-pay | Admitting: Cardiology

## 2023-03-02 ENCOUNTER — Ambulatory Visit: Payer: Medicare Other | Attending: Cardiology | Admitting: Cardiology

## 2023-03-02 VITALS — BP 126/64 | HR 90 | Ht 74.0 in | Wt 206.0 lb

## 2023-03-02 DIAGNOSIS — Q231 Congenital insufficiency of aortic valve: Secondary | ICD-10-CM | POA: Diagnosis not present

## 2023-03-02 DIAGNOSIS — E782 Mixed hyperlipidemia: Secondary | ICD-10-CM | POA: Diagnosis present

## 2023-03-02 DIAGNOSIS — I10 Essential (primary) hypertension: Secondary | ICD-10-CM | POA: Diagnosis not present

## 2023-03-02 DIAGNOSIS — I7121 Aneurysm of the ascending aorta, without rupture: Secondary | ICD-10-CM | POA: Insufficient documentation

## 2023-03-02 NOTE — Progress Notes (Unsigned)
Cardiology Office Note:    Date:  03/02/2023   ID:  Leelend Forker, DOB [birth year], MRN [id number]  PCP:  Gordan Payment., MD  Cardiologist:  Gypsy Balsam, MD    Referring MD: Gordan Payment., MD   Chief Complaint  Patient presents with   Hypertension   L shoulder pain    History of Present Illness:    Glen Young is a 72 y.o. male with past medical history significant for questionable bicuspid aortic valve, mild aortic sufficiency, no arctic stenosis, mild ascending arctic root measuring 40 mm, essential hypertension, cataract.  He comes today for regular follow-up.  Service quite interesting today he had biopsy of his prostate done.  However when it was very painful and there was a time that his blood pressure drop and he also became very nauseated and sweaty he told me I felt like I was dying.  He was given some Coca-Cola after that and things got better and he recovered completely now he is doing fine denies have any chest pain tightness squeezing pressure burning chest.  Did not have any chest pain.  Past Medical History:  Diagnosis Date   Abnormal digital rectal exam 08/29/2020   Anemia, chronic disease 05/26/2018   Formatting of this note might be different from the original. Mild/minimal. Fe def as well. GI negative workup in 2021.   Benign prostatic hyperplasia 11/17/2015   Formatting of this note might be different from the original. Seeing Dr. Logan Bores.   Bicuspid aortic valve 11/17/2015   Cancer (HCC)    Elevated PSA 11/17/2015   Formatting of this note might be different from the original. [place]   Essential hypertension 11/17/2015   Hypertrophy of inferior nasal turbinate 05/29/2018   Last Assessment & Plan:  Formatting of this note might be different from the original. Concern over nasal obstruction. Several month history of nasal congestive symptoms worse on the right side.  At times requiring Afrin therapy.  Not daily though.  No infectious sounding symptoms.  History  of sinus surgery x2 in the past. EXAM shows tremendous inferior turbinates bilaterally.  After decongesting,   Insomnia 11/17/2015   Iron deficiency 07/08/2019   Formatting of this note might be different from the original. Negative workup in 2021.   Microscopic hematuria 07/08/2019   Formatting of this note might be different from the original. [place].   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   Spermatocele of epididymis 05/11/2018    Past Surgical History:  Procedure Laterality Date   APPENDECTOMY     ARTHROTOMY Left 06/27/2018   Procedure: LEFT MIDDLE FINGER DISTAL INTERPHALANGEAL JOINT ARTHROTOMY;  Surgeon: Cindee Salt, MD;  Location: [facility];  Service: Orthopedics;  Laterality: Left;   BACK SURGERY     CYST EXCISION Left 06/27/2018   Procedure: EXCISION MUCOID CYST LEFT MIDDLE FINGER;  Surgeon: Cindee Salt, MD;  Location: [facility];  Service: Orthopedics;  Laterality: Left;   LUMBAR DISC SURGERY     MOUTH SURGERY     NASAL SINUS SURGERY     PROSTATE BIOPSY     WISDOM TOOTH EXTRACTION      Current Medications: Current Meds  Medication Sig   acetaminophen (TYLENOL) 500 MG tablet Take 500 mg by mouth every 6 (six) hours as needed for mild pain or moderate  pain.   amLODipine (NORVASC) 10 MG tablet Take 1 tablet (10 mg total) by mouth daily.   lisinopril (ZESTRIL) 20 MG tablet Take 1 tablet by mouth daily.   Multiple Vitamins-Minerals (PRESERVISION AREDS 2) CAPS Take 1 tablet by mouth daily.   rosuvastatin (CRESTOR) 10 MG tablet Take 1 tablet by mouth daily.   [DISCONTINUED] aspirin EC 81 MG tablet Take 81 mg by mouth daily.   [DISCONTINUED] ibuprofen (ADVIL,MOTRIN) 800 MG tablet Take 800 mg by mouth every 8 (eight) hours as needed for mild pain.   [DISCONTINUED]  traMADol (ULTRAM) 50 MG tablet Take 1 tablet (50 mg total) by mouth every 6 (six) hours as needed. (Patient taking differently: Take 50 mg by mouth every 6 (six) hours as needed for severe pain or moderate pain.)     Allergies:   Codeine   Social History   Socioeconomic History   Marital status: Married    Spouse name: Not on file   Number of children: Not on file   Years of education: Not on file   Highest education level: Not on file  Occupational History   Not on file  Tobacco Use   Smoking status: Never   Smokeless tobacco: Never  Vaping Use   Vaping status: Never Used  Substance and Sexual Activity   Alcohol use: Never   Drug use: Never   Sexual activity: Not on file  Other Topics Concern   Not on file  Social History Narrative   Not on file   Social Determinants of Health   Financial Resource Strain: Not on file  Food Insecurity: Low Risk  (09/18/2022)   [facility]   Food vital sign    Within the past 12 months, you worried that your food would run out before you got money to buy more: Not on file    Within the past 12 months, the food you bought just didn't last and you didn't have money to get more. : Never true  Transportation Needs: Not on file (09/18/2022)  Physical Activity: Not on file  Stress: Not on file  Social Connections: Not on file     Family History: The patient's family history includes Diabetes in his maternal uncle and paternal grandmother; Hypertension in his father. ROS:   Please see the history of present illness.    All 14 point review of systems negative except as described per history of present illness  EKGs/Labs/Other Studies Reviewed:    EKG Interpretation Date/Time:  Wednesday March 02 2023 15:19:31 EDT Ventricular Rate:  90 PR Interval:  144 QRS Duration:  98 QT Interval:  396 QTC Calculation: 484 R Axis:   51  Text Interpretation: Normal sinus rhythm Left ventricular hypertrophy with  repolarization abnormality Abnormal ECG No previous ECGs available Confirmed by Gypsy Balsam [phone]) on 03/02/2023 3:27:22 PM    Recent Labs: No results found for requested labs within last 365 days.  Recent Lipid Panel    Component Value Date/Time   LDLDIRECT 95 10/27/2021 1043    Physical Exam:    VS:  BP 126/64 (BP Location: Left Arm, Patient Position: Sitting)   Pulse 90   Ht 6\' 2"  (1.88 m)   Wt 206 lb (93.4 kg)   SpO2 95%   BMI 26.45 kg/m     Wt Readings from Last 3 Encounters:  03/02/23 206 lb (93.4 kg)  05/04/22 199 lb 3.2 oz (90.4 kg)  10/27/21 198 lb 9.6 oz (90.1 kg)  GEN:  Well nourished, well developed in no acute distress HEENT: Normal NECK: No JVD; No carotid bruits LYMPHATICS: No lymphadenopathy CARDIAC: RRR, no murmurs, no rubs, no gallops RESPIRATORY:  Clear to auscultation without rales, wheezing or rhonchi  ABDOMEN: Soft, non-tender, non-distended MUSCULOSKELETAL:  No edema; No deformity  SKIN: Warm and dry LOWER EXTREMITIES: no swelling NEUROLOGIC:  Alert and oriented x 3 PSYCHIATRIC:  Normal affect   ASSESSMENT:    1. Essential hypertension   2. Aneurysm of ascending aorta without rupture (HCC)   3. Bicuspid aortic valve   4. Mixed hyperlipidemia    PLAN:    In order of problems listed above:  Episode of near syncope today I suspect it was vagal related to painful procedure, however I will schedule him to have echocardiogram to reevaluate his left ventricle ejection fraction.  He does have questionable bicuspid aortic valve I will recheck for degree of aortic insufficiency and potential artery stenosis however I do not see any dramatic findings on the physical examination. Aneurysm of the ascending aorta.  He never had evaluation done for coronary artery disease I will ask him to have a calcium score done that will also also look at the size of the aorta.  Based on that we will decide how aggressive we need to be with his cholesterol  management. Mixed dyslipidemia I did review K PN which show me data from March of this year with LDL of 85 again we will wait for results of calcium score to decide about how aggressive we want to be with management of this,   Medication Adjustments/Labs and Tests Ordered: Current medicines are reviewed at length with the patient today.  Concerns regarding medicines are outlined above.  Orders Placed This Encounter  Procedures   EKG 12-Lead   Medication changes: No orders of the defined types were placed in this encounter.   Signed, Georgeanna Lea, MD, [facility] 03/02/2023 3:49 PM    [facility]

## 2023-03-02 NOTE — Patient Instructions (Signed)
Medication Instructions:  Your physician recommends that you continue on your current medications as directed. Please refer to the Current Medication list given to you today.  *If you need a refill on your cardiac medications before your next appointment, please call your pharmacy*   Lab Work: Your physician recommends that you return for lab work in: Fasting Lipid, AST and ALT Lab opens at 8am. You DO NOT NEED an appointment. Best time to come is between 8am and 12noon and between 1:30 and 4:30. If you have been asked to fast for your blood work please have nothing to eat or drink after midnight. You may have water.   If you have labs (blood work) drawn today and your tests are completely normal, you will receive your results only by: MyChart Message (if you have MyChart) OR A paper copy in the mail If you have any lab test that is abnormal or we need to change your treatment, we will call you to review the results.   Testing/Procedures: Your physician has requested that you have an echocardiogram. Echocardiography is a painless test that uses sound waves to create images of your heart. It provides your doctor with information about the size and shape of your heart and how well your heart's chambers and valves are working. This procedure takes approximately one hour. There are no restrictions for this procedure. Please do NOT wear cologne, perfume, aftershave, or lotions (deodorant is allowed). Please arrive 15 minutes prior to your appointment time.   We will order CT coronary calcium score. It will cost $99.00 and iis due at time of scan.  Please call to schedule.     [facility] [address] [place], [place] [address] [phone]     Follow-Up: At [facility], you and your health needs are our priority.  As part of our continuing mission to provide you with exceptional heart care, we have created designated Provider Care Teams.  These Care Teams  include your primary Cardiologist (physician) and Advanced Practice Providers (APPs -  Physician Assistants and Nurse Practitioners) who all work together to provide you with the care you need, when you need it.  We recommend signing up for the patient portal called "MyChart".  Sign up information is provided on this After Visit Summary.  MyChart is used to connect with patients for Virtual Visits (Telemedicine).  Patients are able to view lab/test results, encounter notes, upcoming appointments, etc.  Non-urgent messages can be sent to your provider as well.   To learn more about what you can do with MyChart, go to [email or web address].    Your next appointment:   6 month(s)  Provider:   Gypsy Balsam, MD    Other Instructions

## 2023-03-10 ENCOUNTER — Ambulatory Visit (HOSPITAL_BASED_OUTPATIENT_CLINIC_OR_DEPARTMENT_OTHER)
Admission: RE | Admit: 2023-03-10 | Discharge: 2023-03-10 | Disposition: A | Discharge status: Home / Self Care | Payer: Self-pay | Source: Ambulatory Visit | Attending: Cardiology | Admitting: Cardiology

## 2023-03-10 DIAGNOSIS — I10 Essential (primary) hypertension: Secondary | ICD-10-CM | POA: Insufficient documentation

## 2023-03-10 DIAGNOSIS — Q231 Congenital insufficiency of aortic valve: Secondary | ICD-10-CM | POA: Insufficient documentation

## 2023-03-10 DIAGNOSIS — I7121 Aneurysm of the ascending aorta, without rupture: Secondary | ICD-10-CM | POA: Insufficient documentation

## 2023-03-10 DIAGNOSIS — E782 Mixed hyperlipidemia: Secondary | ICD-10-CM | POA: Insufficient documentation

## 2023-03-10 LAB — LIPID PANEL
Chol/HDL Ratio: 2.8 ratio (ref 0.0–5.0)
Cholesterol, Total: 185 mg/dL (ref 100–199)
HDL: 66 mg/dL (ref >39)
LDL Chol Calc (NIH): 99 mg/dL (ref 0–99)
Triglycerides: 112 mg/dL (ref 0–149)
VLDL Cholesterol Cal: 20 mg/dL (ref 5–40)

## 2023-03-10 LAB — ALT: ALT: 23 IU/L (ref 0–44)

## 2023-03-10 LAB — AST: AST: 27 IU/L (ref 0–40)

## 2023-04-06 ENCOUNTER — Other Ambulatory Visit: Payer: Medicare Other

## 2023-04-13 DIAGNOSIS — R040 Epistaxis: Secondary | ICD-10-CM

## 2023-04-13 HISTORY — DX: Epistaxis: R04.0

## 2023-04-15 ENCOUNTER — Telehealth: Payer: Self-pay

## 2023-04-15 NOTE — Telephone Encounter (Signed)
Results reviewed with pt as per Dr. Krasowski's note.  Pt verbalized understanding and had no additional questions. Routed to PCP  

## 2023-04-20 ENCOUNTER — Ambulatory Visit: Payer: Medicare Other | Attending: Cardiology

## 2023-04-20 DIAGNOSIS — I7121 Aneurysm of the ascending aorta, without rupture: Secondary | ICD-10-CM | POA: Diagnosis present

## 2023-04-20 DIAGNOSIS — I10 Essential (primary) hypertension: Secondary | ICD-10-CM | POA: Diagnosis not present

## 2023-04-20 DIAGNOSIS — E782 Mixed hyperlipidemia: Secondary | ICD-10-CM | POA: Insufficient documentation

## 2023-04-20 DIAGNOSIS — Q2381 Bicuspid aortic valve: Secondary | ICD-10-CM | POA: Insufficient documentation

## 2023-04-20 LAB — ECHOCARDIOGRAM COMPLETE
P 1/2 time: 310 ms
S' Lateral: 3.8 cm

## 2023-04-26 ENCOUNTER — Telehealth: Payer: Self-pay

## 2023-04-26 NOTE — Telephone Encounter (Signed)
Patient notified of results and verbalized understanding.  

## 2023-04-26 NOTE — Telephone Encounter (Signed)
-----   Message from Norman Herrlich sent at 04/21/2023  8:07 PM EDT ----- Peri Jefferson result Gar Gibbon know she has no aortic stenosis and the degree of valvular regurgitation is mild.

## 2023-06-17 ENCOUNTER — Other Ambulatory Visit: Payer: Self-pay | Admitting: Cardiology

## 2023-08-17 DIAGNOSIS — S52602A Unspecified fracture of lower end of left ulna, initial encounter for closed fracture: Secondary | ICD-10-CM

## 2023-08-17 HISTORY — DX: Unspecified fracture of lower end of left ulna, initial encounter for closed fracture: S52.602A

## 2023-08-26 ENCOUNTER — Encounter: Payer: Self-pay | Admitting: *Deleted

## 2023-08-28 NOTE — Progress Notes (Signed)
 Cardiology Office Note:  .   Date:  08/29/2023  ID:  [name], DOB [birth year], MRN [id number] PCP: Gordan Payment., MD  [facility] Providers Cardiologist:  Gypsy Balsam, MD    History of Present Illness: .   Glen Young is a 73 y.o. 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 Bing Matter on 03/02/2023, he had recently had a near syncopal event following a urological procedure so an echocardiogram was arranged which revealed a tricuspid aortic valve--previously felt that it was possibly bicuspid.  He also underwent a calcium score which revealed a calcium score of 10, this was also to look at his ascending aortic aneurysm which was stable at 40 mm.  He was advised to follow-up in 6 months.  He presents today for follow-up of his hypertension and AAA.  He has been doing well from a cardiac perspective, offers no formal complaints, no further near syncopal episodes.  His blood pressure is elevated today at 160/78, does not traditionally check it at home, he does stagger his blood pressure medications through the day though. He denies chest pain, palpitations, dyspnea, pnd, orthopnea, n, v, dizziness, syncope, edema, weight gain, or early satiety.   ROS: Review of Systems  All other systems reviewed and are negative.    Studies Reviewed: .         Risk Assessment/Calculations:     HYPERTENSION CONTROL Vitals:   08/29/23  1338 08/29/23 1532  BP: (!) 160/78 (!) 160/78    The patient's blood pressure is elevated above target today.  In order to address the patient's elevated BP: Blood pressure will be monitored at home to determine if medication changes need to be made.          Physical Exam:   VS:  BP (!) 160/78   Pulse 75   Ht 6\' 2"  (1.88 m)   Wt 207 lb (93.9 kg)   SpO2 97%   BMI 26.58 kg/m    Wt Readings from Last 3 Encounters:  08/29/23 207 lb (93.9 kg)  03/02/23 206 lb (93.4 kg)  05/04/22 199 lb 3.2 oz (90.4 kg)    GEN: Well nourished, well developed in no acute distress NECK: No JVD; No carotid bruits CARDIAC: RRR, no murmurs, rubs, gallops RESPIRATORY:  Clear to auscultation without rales, wheezing or rhonchi  ABDOMEN: Soft, non-tender, non-distended EXTREMITIES:  No edema; No deformity   ASSESSMENT AND PLAN: .   Ascending aortic aneurysm-most recent was 41 mm, will repeat imaging in 1 year around September 2025.   Hypertension-blood pressure is elevated today 160/78, continue Norvasc 2 mg daily, continue lisinopril 20 mg daily.  Will have him keep a blood pressure log for 2 weeks before we make any changes to his medication regimen.  He currently takes his Norvasc at supper, and his lisinopril at lunchtime.  If his blood pressure is not well-controlled we will increase his lisinopril to 40.  Dyslipidemia-this formally monitored by  his PCP, currently on Crestor 10 mg daily, would prefer his LDL to be less than 70.       Dispo: Blood pressure log for 2 weeks, follow-up in 6 months--at this appointment he will need a CTA of his chest without contrast arranged for surveillance of his AAA.  Signed, Flossie Dibble, NP

## 2023-08-29 ENCOUNTER — Ambulatory Visit: Payer: Medicare Other | Attending: Cardiology | Admitting: Cardiology

## 2023-08-29 VITALS — BP 160/78 | HR 75 | Ht 74.0 in | Wt 207.0 lb

## 2023-08-29 DIAGNOSIS — E782 Mixed hyperlipidemia: Secondary | ICD-10-CM

## 2023-08-29 DIAGNOSIS — I7121 Aneurysm of the ascending aorta, without rupture: Secondary | ICD-10-CM

## 2023-08-29 DIAGNOSIS — I10 Essential (primary) hypertension: Secondary | ICD-10-CM

## 2023-08-29 NOTE — Patient Instructions (Addendum)
Medication Instructions:  Your physician recommends that you continue on your current medications as directed. Please refer to the Current Medication list given to you today.  *If you need a refill on your cardiac medications before your next appointment, please call your pharmacy*   Lab Work: None Ordered If you have labs (blood work) drawn today and your tests are completely normal, you will receive your results only by: MyChart Message (if you have MyChart) OR A paper copy in the mail If you have any lab test that is abnormal or we need to change your treatment, we will call you to review the results.   Testing/Procedures: None Ordered   Follow-Up: At [facility], you and your health needs are our priority.  As part of our continuing mission to provide you with exceptional heart care, we have created designated Provider Care Teams.  These Care Teams include your primary Cardiologist (physician) and Advanced Practice Providers (APPs -  Physician Assistants and Nurse Practitioners) who all work together to provide you with the care you need, when you need it.  We recommend signing up for the patient portal called "MyChart".  Sign up information is provided on this After Visit Summary.  MyChart is used to connect with patients for Virtual Visits (Telemedicine).  Patients are able to view lab/test results, encounter notes, upcoming appointments, etc.  Non-urgent messages can be sent to your provider as well.   To learn more about what you can do with MyChart, go to [email or web address].      Keep a blood pressure log for two weeks, and then return your log to our office.   Your next appointment:    6 month follow up with Dr. Bing Matter

## 2023-09-20 ENCOUNTER — Telehealth: Payer: Self-pay | Admitting: Cardiology

## 2023-09-20 DIAGNOSIS — I10 Essential (primary) hypertension: Secondary | ICD-10-CM

## 2023-09-20 NOTE — Telephone Encounter (Signed)
 Called and spoke to patient and reviewed [facility] result note regarding blood pressure log. Reviewed that Wallis Bamberg recommends for patient to have blood work done to check kidney function prior to medication changes. Patient verbalized understanding and reports he will come get blood work done today. He had no additional questions.

## 2023-09-21 LAB — BASIC METABOLIC PANEL WITH GFR
BUN/Creatinine Ratio: 13 (ref 10–24)
BUN: 12 mg/dL (ref 8–27)
CO2: 20 mmol/L (ref 20–29)
Calcium: 8.7 mg/dL (ref 8.6–10.2)
Chloride: 98 mmol/L (ref 96–106)
Creatinine, Ser: 0.91 mg/dL (ref 0.76–1.27)
Glucose: 161 mg/dL — ABNORMAL HIGH (ref 70–99)
Potassium: 4 mmol/L (ref 3.5–5.2)
Sodium: 135 mmol/L (ref 134–144)
eGFR: 90 mL/min/1.73

## 2023-10-03 ENCOUNTER — Telehealth: Payer: Self-pay | Admitting: Emergency Medicine

## 2023-10-03 DIAGNOSIS — I10 Essential (primary) hypertension: Secondary | ICD-10-CM

## 2023-10-03 MED ORDER — LISINOPRIL 40 MG PO TABS: 40.0000 mg | ORAL_TABLET | Freq: Every day | ORAL | 1 refills | Status: DC

## 2023-10-03 NOTE — Telephone Encounter (Signed)
 Patient came into the office requesting results from lab work on 09/21/2023. Glen Bamberg, NP reviewed lab results and recommends patient to increase Lisinopril to 40 mg once a day, and repeat BMP in two weeks. Reviewed this with patient in office. Patient verbalized understanding and had no additional questions.

## 2023-10-05 ENCOUNTER — Other Ambulatory Visit: Payer: Self-pay | Admitting: Cardiology

## 2023-11-02 ENCOUNTER — Telehealth: Payer: Self-pay

## 2023-11-02 LAB — BASIC METABOLIC PANEL WITH GFR
BUN/Creatinine Ratio: 14 (ref 10–24)
BUN: 13 mg/dL (ref 8–27)
CO2: 22 mmol/L (ref 20–29)
Calcium: 8.9 mg/dL (ref 8.6–10.2)
Chloride: 100 mmol/L (ref 96–106)
Creatinine, Ser: 0.92 mg/dL (ref 0.76–1.27)
Glucose: 139 mg/dL — ABNORMAL HIGH (ref 70–99)
Potassium: 3.9 mmol/L (ref 3.5–5.2)
Sodium: 137 mmol/L (ref 134–144)
eGFR: 88 mL/min/1.73

## 2023-11-02 NOTE — Telephone Encounter (Signed)
 Spoke to patient, reviewed labs results per [facility] notes. Patient verbalized understanding and had no further questions.

## 2023-11-02 NOTE — Telephone Encounter (Signed)
-----   Message from Terrance Ferretti sent at 11/02/2023  8:32 AM EDT ----- Labs look good!

## 2023-11-09 ENCOUNTER — Other Ambulatory Visit: Payer: Self-pay | Admitting: Urology

## 2023-11-09 ENCOUNTER — Telehealth: Payer: Self-pay

## 2023-11-09 ENCOUNTER — Telehealth: Payer: Self-pay | Admitting: Cardiology

## 2023-11-09 NOTE — Telephone Encounter (Signed)
   Pre-operative Risk Assessment    Patient Name: Glen Young  DOB: 1951/06/01 MRN: [id number]   Date of last office visit:  Date of next office visit:    Request for Surgical Clearance    Procedure:  Insertion of Inflatable Penial Prosthesis  Date of Surgery:  Clearance 11-30-23                                Surgeon:  Dr Jarvis Mesa Surgeon's Group or Practice Name:   Phone number:  [phone] Fax number:  [phone]   Type of Clearance Requested:  Medical    Type of Anesthesia:  General    Additional requests/questions:    Signed, Shela Derby   11/09/2023, 1:27 PM

## 2023-11-09 NOTE — Telephone Encounter (Signed)
Patient has been scheduled for televisit.

## 2023-11-09 NOTE — Telephone Encounter (Signed)
   Name: Glen Young  DOB: 13-Sep-1950  MRN: [id number]  Primary Cardiologist: Ralene Burger, MD  Preoperative team, please contact this patient and set up a phone call appointment for further preoperative risk assessment. Please obtain consent and complete medication review. Thank you for your help.  I confirm that guidance regarding antiplatelet and oral anticoagulation therapy has been completed and, if necessary, noted below. None requested.   I also confirmed the patient resides in the state of [place] . As per [facility] Medical Board telemedicine laws, the patient must reside in the state in which the provider is licensed.  [name] D [name], NP 11/09/2023, 3:40 PM [facility]

## 2023-11-09 NOTE — Telephone Encounter (Signed)
 Patient has been scheduled for televisit and consent is done     Patient Consent for Virtual Visit         Glen Young has provided verbal consent on 11/09/2023 for a virtual visit (video or telephone).   CONSENT FOR VIRTUAL VISIT FOR:  Glen Young  By participating in this virtual visit I agree to the following:  I hereby voluntarily request, consent and authorize [facility] and its employed or contracted physicians, physician assistants, nurse practitioners or other licensed health care professionals (the Practitioner), to provide me with telemedicine health care services (the "Services") as deemed necessary by the treating Practitioner. I acknowledge and consent to receive the Services by the Practitioner via telemedicine. I understand that the telemedicine visit will involve communicating with the Practitioner through live audiovisual communication technology and the disclosure of certain medical information by electronic transmission. I acknowledge that I have been given the opportunity to request an in-person assessment or other available alternative prior to the telemedicine visit and am voluntarily participating in the telemedicine visit.  I understand that I have the right to withhold or withdraw my consent to the use of telemedicine in the course of my care at any time, without affecting my right to future care or treatment, and that the Practitioner or I may terminate the telemedicine visit at any time. I understand that I have the right to inspect all information obtained and/or recorded in the course of the telemedicine visit and may receive copies of available information for a reasonable fee.  I understand that some of the potential risks of receiving the Services via telemedicine include:  Delay or interruption in medical evaluation due to technological equipment failure or disruption; Information transmitted may not be sufficient (e.g. poor resolution of images) to  allow for appropriate medical decision making by the Practitioner; and/or  In rare instances, security protocols could fail, causing a breach of personal health information.  Furthermore, I acknowledge that it is my responsibility to provide information about my medical history, conditions and care that is complete and accurate to the best of my ability. I acknowledge that Practitioner's advice, recommendations, and/or decision may be based on factors not within their control, such as incomplete or inaccurate data provided by me or distortions of diagnostic images or specimens that may result from electronic transmissions. I understand that the practice of medicine is not an exact science and that Practitioner makes no warranties or guarantees regarding treatment outcomes. I acknowledge that a copy of this consent can be made available to me via my patient portal [facility] MyChart), or I can request a printed copy by calling the office of [facility].    I understand that my insurance will be billed for this visit.   I have read or had this consent read to me. I understand the contents of this consent, which adequately explains the benefits and risks of the Services being provided via telemedicine.  I have been provided ample opportunity to ask questions regarding this consent and the Services and have had my questions answered to my satisfaction. I give my informed consent for the services to be provided through the use of telemedicine in my medical care

## 2023-11-23 ENCOUNTER — Encounter (HOSPITAL_BASED_OUTPATIENT_CLINIC_OR_DEPARTMENT_OTHER): Payer: Self-pay | Admitting: Urology

## 2023-11-23 ENCOUNTER — Ambulatory Visit: Attending: Cardiology | Admitting: Emergency Medicine

## 2023-11-23 ENCOUNTER — Other Ambulatory Visit: Payer: Self-pay

## 2023-11-23 DIAGNOSIS — Z01810 Encounter for preprocedural cardiovascular examination: Secondary | ICD-10-CM | POA: Diagnosis present

## 2023-11-23 NOTE — Progress Notes (Signed)
 Virtual Visit via Telephone Note   Because of Glen Young co-morbid illnesses, he is at least at moderate risk for complications without adequate follow up.  This format is felt to be most appropriate for this patient at this time.  Due to technical limitations with video connection (technology), today's appointment will be conducted as an audio only telehealth visit, and Glen Young verbally agreed to proceed in this manner.   All issues noted in this document were discussed and addressed.  No physical exam could be performed with this format.  Evaluation Performed:  Preoperative cardiovascular risk assessment _____________   Date:  11/23/2023   Patient ID:  Glen Young, DOB [birth year], MRN [id number] Patient Location:  Home Provider location:   Office  Primary Care Provider:  Abbe Hoard., MD Primary Cardiologist:  Ralene Burger, MD  Chief Complaint / Patient Profile   73 y.o. y/o male with a h/o ascending aortic aneurysm, hypertension, hyperlipidemia who is pending insertion of inflatable penile prosthesis on 11/30/2023 by Dr. Jarvis Mesa and presents today for telephonic preoperative cardiovascular risk assessment.  History of Present Illness    Glen Young is a 73 y.o. male who presents via audio/video conferencing for a telehealth visit today.  Pt was last seen in cardiology clinic on 08/29/2023 by Pattricia Bores, NP.  At that time Glen Young was doing well.  The patient is now pending procedure as outlined above. Since his last visit, he denies chest pain, shortness of breath, lower extremity edema, fatigue, palpitations, melena, hematuria, hemoptysis, diaphoresis, weakness, presyncope, syncope, orthopnea, and PND.  Today patient is doing well overall.  He is without any acute cardiovascular concerns or complaints.  He continues to take his blood pressure at home.  Notes blood pressures better controlled however slightly still above goal.  He remains fairly active and denies  any anginal symptoms.  He is able to complete greater than 4 METS.  Past Medical History    Past Medical History:  Diagnosis Date   Abnormal digital rectal exam 08/29/2020   Anemia, chronic disease 05/26/2018   Formatting of this note might be different from the original. Mild/minimal. Fe def as well. GI negative workup in 2021.   Benign prostatic hyperplasia 11/17/2015   Formatting of this note might be different from the original. Seeing Dr. Luster Salters.   Bicuspid aortic valve 11/17/2015   Cancer (HCC)    Elevated PSA 11/17/2015   Formatting of this note might be different from the original. [place]   Essential hypertension 11/17/2015   Hypertrophy of inferior nasal turbinate 05/29/2018   Last Assessment & Plan:  Formatting of this note might be different from the original. Concern over nasal obstruction. Several month history of nasal congestive symptoms worse on the right side.  At times requiring Afrin therapy.  Not daily though.  No infectious sounding symptoms.  History of sinus surgery x2 in the past. EXAM shows tremendous inferior turbinates bilaterally.  After decongesting,   Insomnia 11/17/2015   Iron deficiency 07/08/2019   Formatting of this note might be different from the original. Negative workup in 2021.   Microscopic hematuria 07/08/2019   Formatting of this note might be different from the original. [place].   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   Spermatocele of epididymis 05/11/2018   Past Surgical History:  Procedure Laterality Date  APPENDECTOMY     ARTHROTOMY Left 06/27/2018   Procedure: LEFT MIDDLE FINGER DISTAL INTERPHALANGEAL JOINT ARTHROTOMY;  Surgeon: Lyanne Sample, MD;  Location: [facility];  Service: Orthopedics;  Laterality: Left;   BACK SURGERY     CYST  EXCISION Left 06/27/2018   Procedure: EXCISION MUCOID CYST LEFT MIDDLE FINGER;  Surgeon: Lyanne Sample, MD;  Location: [facility];  Service: Orthopedics;  Laterality: Left;   LUMBAR DISC SURGERY     MOUTH SURGERY     NASAL SINUS SURGERY     PROSTATE BIOPSY     WISDOM TOOTH EXTRACTION      Allergies  Allergies  Allergen Reactions   Codeine Nausea And Vomiting    Home Medications    Prior to Admission medications   Medication Sig Start Date End Date Taking? Authorizing Provider  acetaminophen (TYLENOL) 500 MG tablet Take 500 mg by mouth every 6 (six) hours as needed for mild pain or moderate pain.    [provider]  amLODipine  (NORVASC ) 10 MG tablet Take 1 tablet (10 mg total) by mouth daily. 10/05/23   Terrance Ferretti, NP  diclofenac (VOLTAREN) 75 MG EC tablet Take 1 tablet by mouth 2 (two) times daily with a meal.    [provider]  lisinopril  (ZESTRIL ) 40 MG tablet Take 1 tablet (40 mg total) by mouth daily. 10/03/23   Terrance Ferretti, NP  Multiple Vitamins-Minerals (PRESERVISION AREDS 2) CAPS Take 1 tablet by mouth daily.    [provider]  rosuvastatin (CRESTOR) 10 MG tablet Take 1 tablet by mouth daily. 11/23/17   [provider]  tamsulosin (FLOMAX) 0.4 MG CAPS capsule Take 1 capsule by mouth at bedtime. 04/08/23   [provider]    Physical Exam    Vital Signs:  Glen Young does not have vital signs available for review today.  Given telephonic nature of communication, physical exam is limited. AAOx3. NAD. Normal affect.  Speech and respirations are unlabored.  Accessory Clinical Findings    None  Assessment & Plan    1.  Preoperative Cardiovascular Risk Assessment: According to the Revised Cardiac Risk Index (RCRI), his Perioperative Risk of Major Cardiac Event is (%): 0.4. His Functional Capacity in METs is: 5.62 according to the Duke Activity Status Index (DASI). Therefore, based on ACC/AHA guidelines,  patient would be at acceptable risk for the planned procedure without further cardiovascular testing.  The patient was advised that if he develops new symptoms prior to surgery to contact our office to arrange for a follow-up visit, and he verbalized understanding.  A copy of this note will be routed to requesting surgeon.  Time:   Today, I have spent 10 minutes with the patient with telehealth technology discussing medical history, symptoms, and management plan.     Ava Boatman, NP  11/23/2023, 9:33 AM

## 2023-11-29 MED ORDER — FLUCONAZOLE IN SODIUM CHLORIDE 200-0.9 MG/100ML-% IV SOLN
200.0000 mg | INTRAVENOUS | Status: DC
  Administered 2023-11-30: 200 mg via INTRAVENOUS
  Filled 2023-11-29 (×2): qty 100

## 2023-11-29 MED ORDER — GENTAMICIN SULFATE 40 MG/ML IJ SOLN
5.0000 mg/kg | INTRAVENOUS | Status: AC
  Administered 2023-11-30: 469.6 mg via INTRAVENOUS
  Filled 2023-11-29 (×2): qty 11.75

## 2023-11-29 MED ORDER — VANCOMYCIN HCL 1500 MG/300ML IV SOLN
1500.0000 mg | INTRAVENOUS | Status: AC
  Administered 2023-11-30: 1500 mg via INTRAVENOUS
  Filled 2023-11-29 (×2): qty 300

## 2023-11-30 ENCOUNTER — Other Ambulatory Visit: Payer: Self-pay

## 2023-11-30 ENCOUNTER — Ambulatory Visit (HOSPITAL_BASED_OUTPATIENT_CLINIC_OR_DEPARTMENT_OTHER): Admitting: Anesthesiology

## 2023-11-30 ENCOUNTER — Encounter (HOSPITAL_BASED_OUTPATIENT_CLINIC_OR_DEPARTMENT_OTHER): Payer: Self-pay | Admitting: Urology

## 2023-11-30 ENCOUNTER — Encounter (HOSPITAL_BASED_OUTPATIENT_CLINIC_OR_DEPARTMENT_OTHER)
Admission: RE | Disposition: A | Discharge status: Home / Self Care | Payer: Self-pay | Source: Home / Self Care | Attending: Urology

## 2023-11-30 ENCOUNTER — Ambulatory Visit (HOSPITAL_BASED_OUTPATIENT_CLINIC_OR_DEPARTMENT_OTHER)
Admission: RE | Admit: 2023-11-30 | Discharge: 2023-11-30 | Disposition: A | Discharge status: Home / Self Care | Attending: Urology | Admitting: Urology

## 2023-11-30 DIAGNOSIS — I10 Essential (primary) hypertension: Secondary | ICD-10-CM | POA: Diagnosis not present

## 2023-11-30 DIAGNOSIS — I472 Ventricular tachycardia, unspecified: Secondary | ICD-10-CM | POA: Insufficient documentation

## 2023-11-30 DIAGNOSIS — N529 Male erectile dysfunction, unspecified: Secondary | ICD-10-CM | POA: Insufficient documentation

## 2023-11-30 DIAGNOSIS — Z01818 Encounter for other preprocedural examination: Secondary | ICD-10-CM

## 2023-11-30 DIAGNOSIS — N5201 Erectile dysfunction due to arterial insufficiency: Secondary | ICD-10-CM | POA: Diagnosis present

## 2023-11-30 DIAGNOSIS — Q2381 Bicuspid aortic valve: Secondary | ICD-10-CM | POA: Diagnosis not present

## 2023-11-30 DIAGNOSIS — E782 Mixed hyperlipidemia: Secondary | ICD-10-CM

## 2023-11-30 HISTORY — PX: PENILE PROSTHESIS IMPLANT: SHX240

## 2023-11-30 SURGERY — INSERTION, PENILE PROSTHESIS
Anesthesia: General | Site: Penis

## 2023-11-30 MED ORDER — ONDANSETRON HCL 4 MG/2ML IJ SOLN
INTRAMUSCULAR | Status: AC
  Filled 2023-11-30: qty 2

## 2023-11-30 MED ORDER — CHLORHEXIDINE GLUCONATE 4 % EX SOLN: Freq: Once | CUTANEOUS | Status: DC

## 2023-11-30 MED ORDER — CELECOXIB 200 MG PO CAPS: 200.0000 mg | ORAL_CAPSULE | Freq: Two times a day (BID) | ORAL | 1 refills | Status: AC | PRN

## 2023-11-30 MED ORDER — IRRISEPT - 450ML BOTTLE WITH 0.05% CHG IN STERILE WATER, USP 99.95% OPTIME
TOPICAL | Status: DC | PRN
  Administered 2023-11-30: 900 mL
  Administered 2023-11-30: 450 mL

## 2023-11-30 MED ORDER — SULFAMETHOXAZOLE-TRIMETHOPRIM 800-160 MG PO TABS: 1.0000 | ORAL_TABLET | Freq: Two times a day (BID) | ORAL | 0 refills | Status: DC

## 2023-11-30 MED ORDER — VASOPRESSIN 20 UNIT/ML IV SOLN
INTRAVENOUS | Status: DC | PRN
  Administered 2023-11-30 (×3): 2 [IU] via INTRAVENOUS

## 2023-11-30 MED ORDER — PROPOFOL 10 MG/ML IV BOLUS
INTRAVENOUS | Status: AC
  Filled 2023-11-30: qty 20

## 2023-11-30 MED ORDER — OXYCODONE HCL 5 MG PO TABS: 5.0000 mg | ORAL_TABLET | Freq: Four times a day (QID) | ORAL | 0 refills | Status: AC | PRN

## 2023-11-30 MED ORDER — PHENYLEPHRINE 80 MCG/ML (10ML) SYRINGE FOR IV PUSH (FOR BLOOD PRESSURE SUPPORT)
PREFILLED_SYRINGE | INTRAVENOUS | Status: DC | PRN
  Administered 2023-11-30: 160 ug via INTRAVENOUS

## 2023-11-30 MED ORDER — SODIUM CHLORIDE 0.9 % IR SOLN
Status: DC | PRN
  Administered 2023-11-30: 1000 mL

## 2023-11-30 MED ORDER — PHENYLEPHRINE 80 MCG/ML (10ML) SYRINGE FOR IV PUSH (FOR BLOOD PRESSURE SUPPORT)
PREFILLED_SYRINGE | INTRAVENOUS | Status: AC
  Filled 2023-11-30: qty 10

## 2023-11-30 MED ORDER — VASOPRESSIN 20 UNIT/ML IV SOLN
INTRAVENOUS | Status: AC
  Filled 2023-11-30: qty 1

## 2023-11-30 MED ORDER — DEXAMETHASONE SODIUM PHOSPHATE 10 MG/ML IJ SOLN
INTRAMUSCULAR | Status: DC | PRN
  Administered 2023-11-30: 8 mg via INTRAVENOUS

## 2023-11-30 MED ORDER — ACETAMINOPHEN 500 MG PO TABS
ORAL_TABLET | ORAL | Status: AC
  Filled 2023-11-30: qty 2

## 2023-11-30 MED ORDER — DEXAMETHASONE SODIUM PHOSPHATE 10 MG/ML IJ SOLN
INTRAMUSCULAR | Status: AC
  Filled 2023-11-30: qty 1

## 2023-11-30 MED ORDER — ACETAMINOPHEN 500 MG PO TABS
1000.0000 mg | ORAL_TABLET | Freq: Once | ORAL | Status: AC
  Administered 2023-11-30: 1000 mg via ORAL

## 2023-11-30 MED ORDER — STERILE WATER FOR IRRIGATION IR SOLN
Status: DC | PRN
Start: 2023-11-30 — End: 2023-11-30
  Administered 2023-11-30: 1000 mL

## 2023-11-30 MED ORDER — OXYCODONE HCL 5 MG PO TABS
ORAL_TABLET | ORAL | Status: AC
  Filled 2023-11-30: qty 1

## 2023-11-30 MED ORDER — PROPOFOL 10 MG/ML IV BOLUS
INTRAVENOUS | Status: DC | PRN
  Administered 2023-11-30: 170 mg via INTRAVENOUS

## 2023-11-30 MED ORDER — ACETAMINOPHEN 500 MG PO TABS: 1000.0000 mg | ORAL_TABLET | Freq: Three times a day (TID) | ORAL | 0 refills | Status: AC

## 2023-11-30 MED ORDER — BUPIVACAINE HCL (PF) 0.5 % IJ SOLN
INTRAMUSCULAR | Status: AC
  Filled 2023-11-30: qty 30

## 2023-11-30 MED ORDER — FENTANYL CITRATE (PF) 100 MCG/2ML IJ SOLN
INTRAMUSCULAR | Status: AC
  Filled 2023-11-30: qty 2

## 2023-11-30 MED ORDER — FENTANYL CITRATE (PF) 100 MCG/2ML IJ SOLN
INTRAMUSCULAR | Status: DC | PRN
  Administered 2023-11-30: 75 ug via INTRAVENOUS

## 2023-11-30 MED ORDER — MUPIROCIN 2 % EX OINT
1.0000 | TOPICAL_OINTMENT | Freq: Once | CUTANEOUS | Status: AC
  Administered 2023-11-30: 1 via NASAL

## 2023-11-30 MED ORDER — LIDOCAINE 2% (20 MG/ML) 5 ML SYRINGE
INTRAMUSCULAR | Status: AC
  Filled 2023-11-30: qty 5

## 2023-11-30 MED ORDER — EPHEDRINE 5 MG/ML INJ
INTRAVENOUS | Status: AC
  Filled 2023-11-30: qty 5

## 2023-11-30 MED ORDER — FENTANYL CITRATE (PF) 100 MCG/2ML IJ SOLN: 25.0000 ug | INTRAMUSCULAR | Status: DC | PRN

## 2023-11-30 MED ORDER — EPHEDRINE SULFATE-NACL 50-0.9 MG/10ML-% IV SOSY
PREFILLED_SYRINGE | INTRAVENOUS | Status: DC | PRN
  Administered 2023-11-30: 10 mg via INTRAVENOUS
  Administered 2023-11-30: 5 mg via INTRAVENOUS
  Administered 2023-11-30: 10 mg via INTRAVENOUS

## 2023-11-30 MED ORDER — GLYCOPYRROLATE PF 0.2 MG/ML IJ SOSY
PREFILLED_SYRINGE | INTRAMUSCULAR | Status: AC
  Filled 2023-11-30: qty 1

## 2023-11-30 MED ORDER — MIDAZOLAM HCL 2 MG/2ML IJ SOLN
INTRAMUSCULAR | Status: AC
Start: 2023-11-30 — End: ?
  Filled 2023-11-30: qty 2

## 2023-11-30 MED ORDER — ONDANSETRON HCL 4 MG/2ML IJ SOLN
INTRAMUSCULAR | Status: DC | PRN
  Administered 2023-11-30: 4 mg via INTRAVENOUS

## 2023-11-30 MED ORDER — ONDANSETRON HCL 4 MG/2ML IJ SOLN: 4.0000 mg | Freq: Once | INTRAMUSCULAR | Status: DC | PRN

## 2023-11-30 MED ORDER — LACTATED RINGERS IV SOLN: INTRAVENOUS | Status: DC

## 2023-11-30 MED ORDER — LIDOCAINE HCL (PF) 1 % IJ SOLN
INTRAMUSCULAR | Status: DC | PRN
  Administered 2023-11-30: 10 mL

## 2023-11-30 MED ORDER — OXYCODONE HCL 5 MG PO TABS
5.0000 mg | ORAL_TABLET | Freq: Once | ORAL | Status: AC
  Administered 2023-11-30: 5 mg via ORAL

## 2023-11-30 MED ORDER — LIDOCAINE HCL (PF) 1 % IJ SOLN
INTRAMUSCULAR | Status: AC
  Filled 2023-11-30: qty 30

## 2023-11-30 MED ORDER — LIDOCAINE 2% (20 MG/ML) 5 ML SYRINGE
INTRAMUSCULAR | Status: DC | PRN
Start: 2023-11-30 — End: 2023-11-30
  Administered 2023-11-30: 100 mg via INTRAVENOUS

## 2023-11-30 MED ORDER — SODIUM CHLORIDE (PF) 0.9 % IJ SOLN
INTRAMUSCULAR | Status: AC
  Filled 2023-11-30: qty 20

## 2023-11-30 MED ORDER — MUPIROCIN 2 % EX OINT
TOPICAL_OINTMENT | CUTANEOUS | Status: AC
  Filled 2023-11-30: qty 22

## 2023-11-30 SURGICAL SUPPLY — 48 items
BLADE SURG 15 STRL LF DISP TIS (BLADE) ×1 IMPLANT
BNDG GAUZE DERMACEA FLUFF 4 (GAUZE/BANDAGES/DRESSINGS) ×1 IMPLANT
BRIEF MESH DISP 2XL (UNDERPADS AND DIAPERS) ×1 IMPLANT
CATH COUDE 5CC RIBBED (CATHETERS) ×1 IMPLANT
CHLORAPREP W/TINT 26 (MISCELLANEOUS) ×2 IMPLANT
COVER BACK TABLE 60X90IN (DRAPES) ×1 IMPLANT
COVER MAYO STAND STRL (DRAPES) ×2 IMPLANT
DERMABOND ADVANCED .7 DNX12 (GAUZE/BANDAGES/DRESSINGS) ×1 IMPLANT
DRAIN CHANNEL 10F 3/8 F FF (DRAIN) ×1 IMPLANT
DRAPE INCISE IOBAN 66X45 STRL (DRAPES) ×1 IMPLANT
DRAPE LAPAROTOMY 100X72 PEDS (DRAPES) ×1 IMPLANT
DRAPE UTILITY XL STRL (DRAPES) ×1 IMPLANT
DRSG TEGADERM 2-3/8X2-3/4 SM (GAUZE/BANDAGES/DRESSINGS) IMPLANT
DRSG TEGADERM 4X4.75 (GAUZE/BANDAGES/DRESSINGS) ×1 IMPLANT
EVACUATOR SILICONE 100CC (DRAIN) ×1 IMPLANT
GAUZE 4X4 16PLY ~~LOC~~+RFID DBL (SPONGE) IMPLANT
GAUZE SPONGE 4X4 12PLY STRL (GAUZE/BANDAGES/DRESSINGS) ×1 IMPLANT
GLOVE BIO SURGEON STRL SZ7 (GLOVE) ×1 IMPLANT
GLOVE BIOGEL PI IND STRL 7.0 (GLOVE) ×1 IMPLANT
GOWN STRL REUS W/TWL LRG LVL3 (GOWN DISPOSABLE) ×1 IMPLANT
GOWN STRL SURGICAL XL XLNG (GOWN DISPOSABLE) IMPLANT
HIBICLENS CHG 4% 4OZ BTL (MISCELLANEOUS) IMPLANT
IMPL RTE SNAPCONE CX 1.0 (Urological Implant) IMPLANT
KIT ACCESSORY AMS 700 PUMP (Urological Implant) IMPLANT
LAVAGE JET IRRISEPT WOUND (IRRIGATION / IRRIGATOR) ×2 IMPLANT
NDL HYPO 22X1.5 SAFETY MO (MISCELLANEOUS) ×1 IMPLANT
NEEDLE HYPO 22X1.5 SAFETY MO (MISCELLANEOUS) ×1 IMPLANT
PACK BASIN DAY SURGERY FS (CUSTOM PROCEDURE TRAY) ×1 IMPLANT
PENCIL SMOKE EVACUATOR (MISCELLANEOUS) ×1 IMPLANT
PLUG CATH AND CAP STRL 200 (CATHETERS) ×1 IMPLANT
PUMP PENILE AMS 700CX MS 12X21 (Erectile Restoration) IMPLANT
RESERVOIR FLAT IZ 100ML (Miscellaneous) IMPLANT
RETRACTOR DEEP SCROTAL PENILE (MISCELLANEOUS) IMPLANT
SET COLLECT BLD 21X.75 12 PB G (NEEDLE) ×1 IMPLANT
SLEEVE SCD COMPRESS KNEE MED (STOCKING) ×1 IMPLANT
SUT ETHILON 3 0 PS 1 (SUTURE) ×1 IMPLANT
SUT MNCRL AB 4-0 PS2 18 (SUTURE) ×1 IMPLANT
SUT VIC AB 2-0 UR6 27 (SUTURE) ×4 IMPLANT
SUT VIC AB 3-0 SH 27X BRD (SUTURE) ×2 IMPLANT
SYR 10ML LL (SYRINGE) ×3 IMPLANT
SYR 50ML LL SCALE MARK (SYRINGE) ×3 IMPLANT
SYR BULB IRRIG 60ML STRL (SYRINGE) ×1 IMPLANT
SYR CONTROL 10ML LL (SYRINGE) ×1 IMPLANT
TOWEL GREEN STERILE FF (TOWEL DISPOSABLE) ×2 IMPLANT
TRAY DSU PREP LF (CUSTOM PROCEDURE TRAY) ×1 IMPLANT
TUBE CONNECTING 20X1/4 (TUBING) ×1 IMPLANT
WATER STERILE IRR 1000ML POUR (IV SOLUTION) ×1 IMPLANT
YANKAUER SUCT BULB TIP NO VENT (SUCTIONS) ×1 IMPLANT

## 2023-11-30 NOTE — Discharge Instructions (Addendum)
 Penile prosthesis postoperative instructions  Wound:  In most cases your incision will have absorbable sutures that will dissolve within the first 10-20 days. Some will fall out even earlier. Expect some redness as the sutures dissolved but this should occur only around the sutures. If there is generalized redness, especially with increasing pain or swelling, let us  know. The scrotum and penis will very likely get "black and blue" as the blood in the tissues spread. Sometimes the whole scrotum will turn colors. The black and blue is followed by a yellow and brown color. In time, all the discoloration will go away. In some cases some firm swelling in the area of the testicle and pump may persist for up to 4-6 weeks after the surgery and is considered normal in most cases.  Drain:   You may be discharged home with a drain in place. If so, you will be taught how to empty it and should keep track of the output. Additionally, you should call the office to arrange for an appointment to have it removed after a few days.   Diet:  You may return to your normal diet within 24 hours following your surgery. You may note some mild nausea and possibly vomiting the first 6-8 hours following surgery. This is usually due to the side effects of anesthesia, and will disappear quite soon. I would suggest clear liquids and a very light meal the first evening following your surgery.  Activity:  Your physical activity should be restricted the first 48 hours. During that time you should remain relatively inactive, moving about only when necessary. During the first 3 weeks following surgery you should avoid lifting any heavy objects (anything greater than 15 pounds), and avoid strenuous exercise. If you work, ask us  specifically about your restrictions, both for work and home. We will write a note to your employer if needed.  Avoid using your penis until your follow up visit with Dr Jarvis Mesa, which will typically be around  3-4 weeks following the surgery. Most people are able to start cycling their device after that appointment, and can have intercourse soon thereafter.   You should plan to wear a tight pair of jockey shorts or an athletic supporter for the first 4-5 days, even to sleep. This will keep the scrotum immobilized to some degree and keep the swelling down.The position of your penis will determine what is most comfortable but I strongly urge you to keep the penis in the "up" position (toward your head). You should continue to tuck "up" your penis when possible for the first 3 months following surgery.  Ice packs should be placed on and off over the scrotum for the first 48 hours. Frozen peas or corn in a ZipLock bag can be frozen, used and re-frozen. Fifteen minutes on and 15 minutes off is a reasonable schedule. The ice is a good pain reliever and keeps the swelling down.  Hygiene:  You may shower 48 hours after your surgery. Tub bathing should be restricted until the wound is completely healed, typically around 2-3 weeks.  Medication:  You will be sent home with some type of pain medication. In many cases you will be sent home with a strong anti-inflammatory medication (Celebrex, Meloxicam ) and a narcotic pain pill (hydrocodone or oxycodone ). You can also supplement these medications with tylenol (acetaminophen). If the pain medication you are sent home with does not control the pain, please notify the office Problems you should report to us :  Fever of 101.0 degrees  Fahrenheit or greater. Moderate or severe swelling under the skin incision or involving the scrotum. Drug reaction such as hives, a rash, nausea or vomiting.   Post Anesthesia Home Care Instructions  Activity: Get plenty of rest for the remainder of the day. A responsible individual must stay with you for 24 hours following the procedure.  For the next 24 hours, DO NOT: -Drive a car -Advertising copywriter -Drink alcoholic  beverages -Take any medication unless instructed by your physician -Make any legal decisions or sign important papers.  Meals: Start with liquid foods such as gelatin or soup. Progress to regular foods as tolerated. Avoid greasy, spicy, heavy foods. If nausea and/or vomiting occur, drink only clear liquids until the nausea and/or vomiting subsides. Call your physician if vomiting continues.  Special Instructions/Symptoms: Your throat may feel dry or sore from the anesthesia or the breathing tube placed in your throat during surgery. If this causes discomfort, gargle with warm salt water. The discomfort should disappear within 24 hours.  If you had a scopolamine  patch placed behind your ear for the management of post- operative nausea and/or vomiting:  1. The medication in the patch is effective for 72 hours, after which it should be removed.  Wrap patch in a tissue and discard in the trash. Wash hands thoroughly with soap and water. 2. You may remove the patch earlier than 72 hours if you experience unpleasant side effects which may include dry mouth, dizziness or visual disturbances. 3. Avoid touching the patch. Wash your hands with soap and water after contact with the patch.     Next dose of tylenol  is at 1:08pm

## 2023-11-30 NOTE — Anesthesia Procedure Notes (Signed)
 Procedure Name: LMA Insertion Date/Time: 11/30/2023 8:34 AM  Performed by: Glo Larch, CRNAPre-anesthesia Checklist: Patient identified, Emergency Drugs available, Suction available and Patient being monitored Patient Re-evaluated:Patient Re-evaluated prior to induction Oxygen Delivery Method: Circle system utilized Preoxygenation: Pre-oxygenation with 100% oxygen Induction Type: IV induction Ventilation: Mask ventilation without difficulty LMA: LMA inserted LMA Size: 5.0 Number of attempts: 1 Airway Equipment and Method: Bite block Placement Confirmation: positive ETCO2 Tube secured with: Tape Dental Injury: Teeth and Oropharynx as per pre-operative assessment

## 2023-11-30 NOTE — H&P (Signed)
 H&P  History of Present Illness: Glen Young is a 73 y.o. year old M who presents today for insertion of an inflatable penile prosthesis  No acute complaints  Past Medical History:  Diagnosis Date   Abnormal digital rectal exam 08/29/2020   Anemia, chronic disease 05/26/2018   Formatting of this note might be different from the original. Mild/minimal. Fe def as well. GI negative workup in 2021.   Benign prostatic hyperplasia 11/17/2015   Formatting of this note might be different from the original. Seeing Dr. Luster Salters.   Bicuspid aortic valve 11/17/2015   Elevated PSA 11/17/2015   Formatting of this note might be different from the original. [place]   Essential hypertension 11/17/2015   Hypertrophy of inferior nasal turbinate 05/29/2018   Last Assessment & Plan:  Formatting of this note might be different from the original. Concern over nasal obstruction. Several month history of nasal congestive symptoms worse on the right side.  At times requiring Afrin therapy.  Not daily though.  No infectious sounding symptoms.  History of sinus surgery x2 in the past. EXAM shows tremendous inferior turbinates bilaterally.  After decongesting,   Insomnia 11/17/2015   Iron deficiency 07/08/2019   Formatting of this note might be different from the original. Negative workup in 2021.   Microscopic hematuria 07/08/2019   Formatting of this note might be different from the original. [place].   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   Spermatocele of epididymis 05/11/2018    Past Surgical History:  Procedure Laterality Date   APPENDECTOMY     ARTHROTOMY Left 06/27/2018   Procedure: LEFT MIDDLE FINGER DISTAL INTERPHALANGEAL JOINT ARTHROTOMY;  Surgeon: Lyanne Sample, MD;  Location: [facility];  Service:  Orthopedics;  Laterality: Left;   BACK SURGERY     CYST EXCISION Left 06/27/2018   Procedure: EXCISION MUCOID CYST LEFT MIDDLE FINGER;  Surgeon: Lyanne Sample, MD;  Location: [facility];  Service: Orthopedics;  Laterality: Left;   LUMBAR DISC SURGERY     MOUTH SURGERY     NASAL SINUS SURGERY     PROSTATE BIOPSY     WISDOM TOOTH EXTRACTION      Home Medications:  Current Meds  Medication Sig   acetaminophen (TYLENOL) 500 MG tablet Take 500 mg by mouth every 6 (six) hours as needed for mild pain or moderate pain.   amLODipine  (NORVASC ) 10 MG tablet Take 1 tablet (10 mg total) by mouth daily.   lisinopril  (ZESTRIL ) 40 MG tablet Take 1 tablet (40 mg total) by mouth daily.   Multiple Vitamins-Minerals (PRESERVISION AREDS 2) CAPS Take 1 tablet by mouth daily.   rosuvastatin (CRESTOR) 10 MG tablet Take 1 tablet by mouth daily.   tamsulosin (FLOMAX) 0.4 MG CAPS capsule Take 1 capsule by mouth at bedtime.    Allergies:  Allergies  Allergen Reactions   Codeine Nausea And Vomiting    Family History  Problem Relation Age of Onset   Diabetes Maternal Uncle    Diabetes Paternal Grandmother    Hypertension Father     Social History:  reports that he has never smoked. He has never used smokeless tobacco. He reports that he does not drink alcohol and does not use drugs.  ROS: A complete review of systems was performed.  All systems are negative except for pertinent  findings as noted.  Physical Exam:  Vital signs in last 24 hours: Temp:  [97.3 F (36.3 C)] 97.3 F (36.3 C) (05/21 0701) Pulse Rate:  [101] 101 (05/21 0701) BP: (172)/(78) 172/78 (05/21 0701) SpO2:  [99 %] 99 % (05/21 0701) Weight:  [88.8 kg] 88.8 kg (05/21 0701) Constitutional:  Alert and oriented, No acute distress Cardiovascular: Regular rate and rhythm Respiratory: Normal respiratory effort, Lungs clear bilaterally GI: Abdomen is soft, nontender, nondistended, no abdominal masses Lymphatic: No  lymphadenopathy Neurologic: Grossly intact, no focal deficits Psychiatric: Normal mood and affect   Laboratory Data:  No results for input(s): "WBC", "HGB", "HCT", "PLT" in the last 72 hours.  No results for input(s): "NA", "K", "CL", "GLUCOSE", "BUN", "CALCIUM", "CREATININE" in the last 72 hours.  Invalid input(s): "CO3"   No results found for this or any previous visit (from the past 24 hours). No results found for this or any previous visit (from the past 240 hours).  Renal Function: No results for input(s): "CREATININE" in the last 168 hours. CrCl cannot be calculated (Patient's most recent lab result is older than the maximum 21 days allowed.).  Radiologic Imaging: No results found.  Assessment:  Glen Young is a 73 y.o. 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 Procedure and risks reviewed, including but not limited to bleeding, infection, implant infection, implant malfunction, implant malplacement, erosion, damage to adjacent structures, pain, urinary retention. All questions answered   Julene Oaks, MD 11/30/2023, 8:11 AM  [facility] Pager: [phone]

## 2023-11-30 NOTE — Anesthesia Preprocedure Evaluation (Addendum)
 Anesthesia Evaluation  Patient identified by MRN, date of birth, ID band Patient awake    Reviewed: Allergy & Precautions, NPO status , Patient's Chart, lab work & pertinent test results  History of Anesthesia Complications Negative for: history of anesthetic complications  Airway Mallampati: I  TM Distance: >3 FB Neck ROM: Full    Dental  (+) Teeth Intact, Dental Advisory Given   Pulmonary neg pulmonary ROS   Pulmonary exam normal breath sounds clear to auscultation       Cardiovascular hypertension, Pt. on medications (-) angina (-) Past MI Normal cardiovascular exam+ dysrhythmias Ventricular Tachycardia + Valvular Problems/Murmurs (Bicuspid aoritc valve)  Rhythm:Regular Rate:Normal     Neuro/Psych negative neurological ROS  negative psych ROS   GI/Hepatic negative GI ROS, Neg liver ROS,,,  Endo/Other  negative endocrine ROS    Renal/GU negative Renal ROS   Erectile dysfunction due to arterial insufficiency     Musculoskeletal  (+) Arthritis ,    Abdominal   Peds  Hematology negative hematology ROS (+)   Anesthesia Other Findings Day of surgery medications reviewed with the patient.  Reproductive/Obstetrics                             Anesthesia Physical Anesthesia Plan  ASA: 3  Anesthesia Plan: General   Post-op Pain Management: Tylenol PO (pre-op)* and Toradol IV (intra-op)*   Induction: Intravenous  PONV Risk Score and Plan: 2 and Dexamethasone and Ondansetron  Airway Management Planned: LMA  Additional Equipment:   Intra-op Plan:   Post-operative Plan: Extubation in OR  Informed Consent: I have reviewed the patients History and Physical, chart, labs and discussed the procedure including the risks, benefits and alternatives for the proposed anesthesia with the patient or authorized representative who has indicated his/her understanding and acceptance.     Dental  advisory given  Plan Discussed with: CRNA  Anesthesia Plan Comments:        Anesthesia Quick Evaluation

## 2023-11-30 NOTE — Transfer of Care (Signed)
 Immediate Anesthesia Transfer of Care Note  Patient: Glen Young  Procedure(s) Performed: Procedure(s) (LRB): INSERTION, PENILE PROSTHESIS (N/A)  Patient Location: PACU  Anesthesia Type: General  Level of Consciousness: awake, oriented, sedated and patient cooperative  Airway & Oxygen Therapy: Patient Spontanous Breathing and Patient connected to face mask oxygen  Post-op Assessment: Report given to PACU RN and Post -op Vital signs reviewed and stable  Post vital signs: Reviewed and stable  Complications: No apparent anesthesia complications Last Vitals:  Vitals Value Taken Time  BP 123/61 11/30/23 1030  Temp    Pulse 77 11/30/23 1030  Resp 18 11/30/23 1030  SpO2 99 % 11/30/23 1030    Last Pain:  Vitals:   11/30/23 0701  TempSrc: Temporal  PainSc: 0-No pain      Patients Stated Pain Goal: 3 (11/30/23 0701)  Complications: No notable events documented.

## 2023-11-30 NOTE — Anesthesia Postprocedure Evaluation (Signed)
 Anesthesia Post Note  Patient: Glen Young  Procedure(s) Performed: INSERTION, PENILE PROSTHESIS (Penis)     Patient location during evaluation: PACU Anesthesia Type: General Level of consciousness: awake and alert Pain management: pain level controlled Vital Signs Assessment: post-procedure vital signs reviewed and stable Respiratory status: spontaneous breathing, nonlabored ventilation and respiratory function stable Cardiovascular status: blood pressure returned to baseline and stable Postop Assessment: no apparent nausea or vomiting Anesthetic complications: no   No notable events documented.  Last Vitals:  Vitals:   11/30/23 1049 11/30/23 1226  BP: 129/68 (!) 108/58  Pulse: 91 80  Resp: 14 20  Temp:    SpO2: 93% 97%    Last Pain:  Vitals:   11/30/23 1126  TempSrc:   PainSc: 9                  Erin Havers

## 2023-11-30 NOTE — Op Note (Signed)
 PATIENT:  Glen Young  PRE-OPERATIVE DIAGNOSIS:  Organic erectile dysfunction  POST-OPERATIVE DIAGNOSIS:  Same  PROCEDURE:   3 piece inflatable penile prosthesis (BS/AMS)  SURGEON:  Julene Oaks MD  ASST: none  INDICATION: He has had long-standing organic erectile dysfunction and refractory to other modes of treatment. He has elected to proceed with prosthesis implantation.  ANESTHESIA:  General  EBL:  50 cc  Device: 3 piece AMS CX 700: 98 cc reservoir, 21 cm cylinders and 1 cm rear-tip extenders on right and left sides  LOCAL MEDICATIONS USED:   penile block done with 10 cc lidocaine /marcaine  mixture  SPECIMEN: None  Description of procedure: The patient was taken to the major operating room, placed on the table and administered general anesthesia in the supine position. His genitalia was then prepped with chlorhexidine  x 2. He was draped in the usual sterile fashion, and I used Ioban on the field. An official timeout was then performed.  A dorsal penile block was performed.  A 14 French coude catheter was then placed in the bladder and the bladder was drained and the catheter was plugged. A midline penoscrotal incision was then made and the dissection was carried down to the corpora and urethra. The lonestar retractor was positioned so as to have excellent exposure. 2-0 Vicryl sutures were then placed proximally in each corpus cavernosum to serve as stay sutures. An incision was then made in the corpus cavernosum first on the left-hand side with the bovie. Seabron [place] were used to gently dilate the opening. I then dilated the corpus cavernosum with the a 12 Fr brooks dilator distally and proximally. Field goal post tests were performed and there was no evidence of perforations or crossover. I then irrigated the corpus cavernosum with antibiotic solution and measured the distance proximally and distally from the stay suture and was found to be 9 and 12.5 cm, respectively.I then turned my  attention to the contralateral corpus cavernosum and placed my stay sutures, made my corporotomy and dilated the corpus cavernosum in an identical fashion. This was measured and also was found to be 9 cm proximally and 13 distally. It was irrigated with anastomotic solution as was the scrotum. I then chose an 21 cm cylinder set with 1 cm rear-tip extenders and these were prepped while I prepared the site for reservoir placement.  I then digitally probed into the Left external inguinal ring. My finger was used to poke through the posterior wall of the ring. I used my finger to ensure I was in the appropriate space, and to clear room for the reservoir. I irrigated the space with anastomotic solution and then placed the reservoir in this location. I then filled the reservoir with 98 cc of sterile saline, and checked to confirm proper position. There was minimal backpressure with the reservoir max-filled.  Attention was redirected to the corporotomies where the cylinders were then placed by first fixing the suture to the distal aspect of the right cylinder to a straight needle. This was then loaded on the [facility] inserter and passed through the corporotomy and distally. I then advanced the straight needle with the Furlow inserter out through the glans and this was grasped with a hemostat and pulled through the glans and the suture was secured with a hemostat. I then performed an identical maneuver on the contralateral side. After this was performed I irrigated both corpus cavernosum; there was no evidence of urethral perforation. I inserted the distal portion of the cylinder through the corporotomies and  pulled this to the end of the corpora with the suture. The proximal aspect with the rear-tip extender was then passed through the corporotomy and into the seated position on each side. I then connected reservoir tubing to a syringe filled with sterile saline and inflated the device. I noted a good straight erection  with both cylinders equidistant under the glans and no buckling of the cylinders. I therefore deflated the device and closed the corporotomies with used my previously placed stay sutures.   I then grasped the scrotal skin in the midline with a babcock, and used a hemostat to dissect down to the dependent-most portion of the scrotum. The nasal speculum was inserted into this space, and facilitated placement of the pump. The cylinder was then connected to the pump after excising the excess tubing with appropriate shodded hemostats in place and then I used the supplied connectors to make the connection. I then again cycled the device with the pump and it cycled properly. I deflated the device and pumped it up about three quarters of the way to aid with hemostasis. I irrigated the wound one last time with antibiotic irrigation and then closed the deep scrotal tissue over the tubing and pump with running 3-0 monocryl suture. I placed a 10 Fr blake drain over the corporotomies. A second layer was then closed over this first layer with running 3- 0 monocryl, and running skin suture w/ 4-0 monocryl performed. Incision dressed with dermabond.  A mummy wrap was applied. The catheter was removed, and drain connected to suction bulb and the patient was awakened and taken recovery room in stable and satisfactory condition. He tolerated the procedure well and there were no intraoperative complications. Needle sponge and instrument counts were correct at the end of the operation.

## 2023-12-01 ENCOUNTER — Encounter (HOSPITAL_BASED_OUTPATIENT_CLINIC_OR_DEPARTMENT_OTHER): Payer: Self-pay | Admitting: Urology

## 2024-02-28 ENCOUNTER — Ambulatory Visit: Attending: Cardiology | Admitting: Cardiology

## 2024-02-28 ENCOUNTER — Encounter: Payer: Self-pay | Admitting: Cardiology

## 2024-02-28 ENCOUNTER — Ambulatory Visit: Admitting: Cardiology

## 2024-02-28 VITALS — BP 110/60 | HR 79 | Ht 73.0 in | Wt 198.6 lb

## 2024-02-28 DIAGNOSIS — R0609 Other forms of dyspnea: Secondary | ICD-10-CM | POA: Diagnosis present

## 2024-02-28 DIAGNOSIS — Q2381 Bicuspid aortic valve: Secondary | ICD-10-CM | POA: Insufficient documentation

## 2024-02-28 DIAGNOSIS — I10 Essential (primary) hypertension: Secondary | ICD-10-CM | POA: Insufficient documentation

## 2024-02-28 DIAGNOSIS — I7121 Aneurysm of the ascending aorta, without rupture: Secondary | ICD-10-CM | POA: Insufficient documentation

## 2024-02-28 DIAGNOSIS — E782 Mixed hyperlipidemia: Secondary | ICD-10-CM | POA: Diagnosis present

## 2024-02-28 DIAGNOSIS — I351 Nonrheumatic aortic (valve) insufficiency: Secondary | ICD-10-CM | POA: Insufficient documentation

## 2024-02-28 NOTE — Patient Instructions (Signed)
 Medication Instructions:  Your physician recommends that you continue on your current medications as directed. Please refer to the Current Medication list given to you today.  *If you need a refill on your cardiac medications before your next appointment, please call your pharmacy*   Lab Work: None Ordered If you have labs (blood work) drawn today and your tests are completely normal, you will receive your results only by: MyChart Message (if you have MyChart) OR A paper copy in the mail If you have any lab test that is abnormal or we need to change your treatment, we will call you to review the results.   Testing/Procedures: October Your physician has requested that you have an echocardiogram. Echocardiography is a painless test that uses sound waves to create images of your heart. It provides your doctor with information about the size and shape of your heart and how well your heart's chambers and valves are working. This procedure takes approximately one hour. There are no restrictions for this procedure. Please do NOT wear cologne, perfume, aftershave, or lotions (deodorant is allowed). Please arrive 15 minutes prior to your appointment time.  Please note: We ask at that you not bring children with you during ultrasound (echo/ vascular) testing. Due to room size and safety concerns, children are not allowed in the ultrasound rooms during exams. Our front office staff cannot provide observation of children in our lobby area while testing is being conducted. An adult accompanying a patient to their appointment will only be allowed in the ultrasound room at the discretion of the ultrasound technician under special circumstances. We apologize for any inconvenience.    Follow-Up: At [facility], you and your health needs are our priority.  As part of our continuing mission to provide you with exceptional heart care, we have created designated Provider Care Teams.  These Care Teams include  your primary Cardiologist (physician) and Advanced Practice Providers (APPs -  Physician Assistants and Nurse Practitioners) who all work together to provide you with the care you need, when you need it.  We recommend signing up for the patient portal called MyChart.  Sign up information is provided on this After Visit Summary.  MyChart is used to connect with patients for Virtual Visits (Telemedicine).  Patients are able to view lab/test results, encounter notes, upcoming appointments, etc.  Non-urgent messages can be sent to your provider as well.   To learn more about what you can do with MyChart, go to [email or web address].    Your next appointment:   6 month(s)  The format for your next appointment:   In Person  Provider:   Lamar Fitch, MD    Other Instructions NA

## 2024-02-28 NOTE — Progress Notes (Signed)
 Cardiology Office Note:    Date:  02/28/2024   ID:  Laurier Clapper, DOB [birth year], MRN [id number]  PCP:  Thurmond Cathlyn LABOR., MD  Cardiologist:  Lamar Fitch, MD    Referring MD: Thurmond Cathlyn LABOR., MD   No chief complaint on file.   History of Present Illness:    Glen Young is a 73 y.o. male with past medical history significant for ascending aortic dilatation measuring 41 mm, essential hypertension, dyslipidemia, questionable bicuspid aortic valve, aortic insufficiency.  Comes today to months for follow-up.  He recently retired but in spite of that he is very busy taking care of her mother as well as mother-in-law who are more than 90s.  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  Denies have any chest pain tightness squeezing pressure burning chest, overall cardiac wise seems to be doing well  Past Medical History:  Diagnosis Date   Abnormal digital rectal exam 08/29/2020   Anemia, chronic disease 05/26/2018   Formatting of this note might be different from the original. Mild/minimal. Fe def as well. GI negative workup in 2021.   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 Seeing Dr. Janit.   Bicuspid aortic valve 11/17/2015   Chronic sinusitis 03/11/2022   Formatting of this note might be different from the original.  History of surgery twice.     Closed fracture of distal end of left ulna 08/17/2023   Combined arterial insufficiency and corporo-venous occlusive erectile dysfunction 10/02/2020   Degeneration of lumbar intervertebral disc 11/17/2015   Elbow arthritis 11/23/2017   Elevated PSA 11/17/2015   Formatting of this note might be different from the original. [facility]   Epistaxis 04/13/2023   Erectile  dysfunction 11/17/2015   Essential hypertension 11/17/2015   History of anemia of chronic disease 05/26/2018   Formatting of this note might be different from the original.  Mild/minimal. Fe def as well. GI negative workup in 2021.     History of iron deficiency 07/08/2019   Formatting of this note might be different from the original.  Negative workup in 2021.     Hypertrophy of inferior nasal turbinate 05/29/2018   Last Assessment & Plan:  Formatting of this note might be different from the original. Concern over nasal obstruction. Several month history of nasal congestive symptoms worse on the right side.  At times requiring Afrin therapy.  Not daily though.  No infectious sounding symptoms.  History of sinus surgery x2 in the past. EXAM shows tremendous inferior turbinates bilaterally.  After decongesting,   Insomnia 11/17/2015   Iron deficiency 07/08/2019   Formatting of this note might be different from the original. Negative workup in 2021.   Microscopic hematuria 07/08/2019   Formatting of this note might be different from the original. [facility].   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  Minimal  Past Surgical History:  Procedure Laterality Date   APPENDECTOMY     ARTHROTOMY Left 06/27/2018   Procedure: LEFT MIDDLE FINGER DISTAL INTERPHALANGEAL JOINT ARTHROTOMY;  Surgeon: Murrell Kuba, MD;  Location: [facility];  Service: Orthopedics;  Laterality: Left;   BACK SURGERY     CYST EXCISION Left 06/27/2018   Procedure: EXCISION MUCOID CYST LEFT MIDDLE FINGER;  Surgeon: Murrell Kuba, MD;  Location: [facility];  Service: Orthopedics;  Laterality: Left;   LUMBAR DISC SURGERY     MOUTH SURGERY     NASAL SINUS SURGERY     PENILE PROSTHESIS IMPLANT N/A 11/30/2023   Procedure: INSERTION, PENILE PROSTHESIS;  Surgeon: Lovie Arlyss CROME, MD;  Location: [facility];  Service: Urology;  Laterality: N/A;  INSERTION OF INFLATABLE PENILE PROSTHESIS   PROSTATE BIOPSY     WISDOM TOOTH EXTRACTION      Current Medications: Current Meds  Medication Sig   acetaminophen  (TYLENOL ) 500 MG tablet Take 2 tablets (1,000 mg total) by mouth every 8 (eight) hours.   amLODipine  (NORVASC ) 10 MG tablet Take 1 tablet (10 mg total) by mouth daily.   lisinopril  (ZESTRIL ) 40 MG tablet Take 1 tablet (40 mg total) by mouth daily.   Multiple Vitamins-Minerals (PRESERVISION AREDS 2) CAPS Take 1 tablet by mouth daily.   rosuvastatin (CRESTOR) 10 MG tablet Take 1 tablet by mouth daily.   tamsulosin (FLOMAX) 0.4 MG CAPS capsule Take 1 capsule by mouth at bedtime.     Allergies:   Codeine   Social History   Socioeconomic History   Marital status: Married    Spouse name: Not on file   Number of children: Not on file   Years of education: Not on file   Highest education level: Not on file  Occupational History   Not on file  Tobacco Use   Smoking status: Never   Smokeless tobacco: Never  Vaping Use   Vaping status: Never Used  Substance and Sexual Activity   Alcohol use: Never   Drug use: Never   Sexual activity: Not on file  Other Topics Concern   Not on file  Social History Narrative   Not on file   Social Drivers of Health   Financial Resource Strain: Not on file  Food Insecurity: Low Risk  (09/18/2022)   Received from [facility]   Hunger Vital Sign    Within the past 12 months, you worried that your food would run out before you got money to buy more: Not on file    Within the past 12 months, the food you bought just didn't last and you didn't have  money to get more. : Never true  Transportation Needs: Not on file (09/18/2022)  Physical Activity: Not on file  Stress: Not on file  Social Connections: Not on file     Family History: The patient's family history includes Diabetes in his maternal uncle and paternal grandmother; Hypertension in his father. ROS:   Please see the history of present illness.    All 14 point review of systems negative except as described per history of present illness  EKGs/Labs/Other Studies Reviewed:    EKG Interpretation Date/Time:  Tuesday February 28 2024 09:09:30 EDT Ventricular Rate:  79 PR Interval:  144 QRS Duration:  96 QT Interval:  394 QTC Calculation: 451 R Axis:   65  Text Interpretation: Normal sinus rhythm Left ventricular hypertrophy with repolarization abnormality Abnormal ECG When compared with ECG of 02-Mar-2023  15:19, No significant change was found Confirmed by Bernie Charleston [phone]) on 02/28/2024 9:14:11 AM    Recent Labs: 03/09/2023: ALT 23 11/01/2023: BUN 13; Creatinine, Ser 0.92; Potassium 3.9; Sodium 137  Recent Lipid Panel    Component Value Date/Time   CHOL 185 03/09/2023 0834   TRIG 112 03/09/2023 0834   HDL 66 03/09/2023 0834   CHOLHDL 2.8 03/09/2023 0834   LDLCALC 99 03/09/2023 0834   LDLDIRECT 95 10/27/2021 1043    Physical Exam:    VS:  BP 110/60   Pulse 79   Ht 6' 1 (1.854 m)   Wt 198 lb 9.6 oz (90.1 kg)   SpO2 95%   BMI 26.20 kg/m     Wt Readings from Last 3 Encounters:  02/28/24 198 lb 9.6 oz (90.1 kg)  11/30/23 195 lb 12.3 oz (88.8 kg)  08/29/23 207 lb (93.9 kg)     GEN:  Well nourished, well developed in no acute distress HEENT: Normal NECK: No JVD; No carotid bruits LYMPHATICS: No lymphadenopathy CARDIAC: RRR, soft holosystolic murmur best heard at the left border of the sternum 1-2/6, no rubs, no gallops RESPIRATORY:  Clear to auscultation without rales, wheezing or rhonchi  ABDOMEN: Soft, non-tender, non-distended MUSCULOSKELETAL:  No  edema; No deformity  SKIN: Warm and dry LOWER EXTREMITIES: no swelling NEUROLOGIC:  Alert and oriented x 3 PSYCHIATRIC:  Normal affect   ASSESSMENT:    1. Essential hypertension   2. Aneurysm of ascending aorta without rupture (HCC)   3. Nonrheumatic aortic valve insufficiency   4. Bicuspid aortic valve   5. Mixed hyperlipidemia    PLAN:    In order of problems listed above:  Essential hypertension blood pressure well-controlled continue present management. Aneurysm of the ascending aorta, will repeat echocardiogram. Nonrheumatic aortic valve insufficiency soft murmur heard, will repeat echocardiogram. Questionable bicuspid aortic valve we will look at the echocardiogram. Mixed dyslipidemia I did review K PN which show me total cholesterol 185 HDL 66 LDL 99 is acceptable number.   Medication Adjustments/Labs and Tests Ordered: Current medicines are reviewed at length with the patient today.  Concerns regarding medicines are outlined above.  Orders Placed This Encounter  Procedures   EKG 12-Lead   Medication changes: No orders of the defined types were placed in this encounter.   Signed, [place] DOROTHA Bernie, MD, [facility] 02/28/2024 9:29 AM    [facility]

## 2024-04-23 ENCOUNTER — Other Ambulatory Visit: Payer: Self-pay | Admitting: Cardiology

## 2024-04-26 ENCOUNTER — Ambulatory Visit: Attending: Cardiology

## 2024-04-26 DIAGNOSIS — R0609 Other forms of dyspnea: Secondary | ICD-10-CM | POA: Diagnosis not present

## 2024-04-26 LAB — ECHOCARDIOGRAM COMPLETE
AR max vel: 2.17 cm2
AV Area VTI: 2.65 cm2
AV Area mean vel: 2.27 cm2
AV Mean grad: 7 mmHg
AV Peak grad: 12.6 mmHg
AV Vena cont: 0.3 cm
Ao pk vel: 1.77 m/s
Area-P 1/2: 3.37 cm2
P 1/2 time: 425 ms
S' Lateral: 4 cm

## 2024-05-07 ENCOUNTER — Ambulatory Visit: Payer: Self-pay | Admitting: Cardiology

## 2024-05-08 NOTE — Telephone Encounter (Signed)
 Patient is returning call.

## 2024-10-02 ENCOUNTER — Ambulatory Visit: Admitting: Cardiology
